# Patient Record
Sex: Female | Born: 1935 | Race: White | Hispanic: No | Smoking: Never smoker
Health system: Southern US, Community
[De-identification: ages and names within clinical notes are randomized; demographics above are authoritative.]

## PROBLEM LIST (undated history)

## (undated) DIAGNOSIS — N83209 Unspecified ovarian cyst, unspecified side: Secondary | ICD-10-CM

## (undated) DIAGNOSIS — M549 Dorsalgia, unspecified: Secondary | ICD-10-CM

## (undated) DIAGNOSIS — K219 Gastro-esophageal reflux disease without esophagitis: Secondary | ICD-10-CM

## (undated) DIAGNOSIS — K8020 Calculus of gallbladder without cholecystitis without obstruction: Secondary | ICD-10-CM

## (undated) DIAGNOSIS — K759 Inflammatory liver disease, unspecified: Secondary | ICD-10-CM

## (undated) DIAGNOSIS — I341 Nonrheumatic mitral (valve) prolapse: Secondary | ICD-10-CM

## (undated) DIAGNOSIS — N200 Calculus of kidney: Secondary | ICD-10-CM

## (undated) DIAGNOSIS — J449 Chronic obstructive pulmonary disease, unspecified: Secondary | ICD-10-CM

## (undated) DIAGNOSIS — E785 Hyperlipidemia, unspecified: Secondary | ICD-10-CM

## (undated) DIAGNOSIS — M2040 Other hammer toe(s) (acquired), unspecified foot: Secondary | ICD-10-CM

## (undated) DIAGNOSIS — H3530 Unspecified macular degeneration: Secondary | ICD-10-CM

## (undated) HISTORY — DX: Unspecified ovarian cyst, unspecified side: N83.209

## (undated) HISTORY — DX: Calculus of gallbladder without cholecystitis without obstruction: K80.20

## (undated) HISTORY — PX: AUGMENTATION MAMMAPLASTY: SUR837

## (undated) HISTORY — DX: Inflammatory liver disease, unspecified: K75.9

## (undated) HISTORY — DX: Calculus of kidney: N20.0

## (undated) HISTORY — DX: Gastro-esophageal reflux disease without esophagitis: K21.9

## (undated) HISTORY — PX: BREAST SURGERY: SHX581

## (undated) HISTORY — DX: Dorsalgia, unspecified: M54.9

---

## 1968-11-06 DIAGNOSIS — N200 Calculus of kidney: Secondary | ICD-10-CM

## 1968-11-06 HISTORY — DX: Calculus of kidney: N20.0

## 2004-08-25 ENCOUNTER — Ambulatory Visit: Payer: Self-pay | Admitting: Internal Medicine

## 2005-05-31 ENCOUNTER — Ambulatory Visit: Payer: Self-pay | Admitting: Internal Medicine

## 2005-07-01 ENCOUNTER — Emergency Department: Payer: Self-pay | Admitting: Emergency Medicine

## 2005-07-02 ENCOUNTER — Emergency Department: Payer: Self-pay | Admitting: Internal Medicine

## 2006-09-25 ENCOUNTER — Ambulatory Visit: Payer: Self-pay | Admitting: Unknown Physician Specialty

## 2007-07-04 ENCOUNTER — Ambulatory Visit: Payer: Self-pay | Admitting: Internal Medicine

## 2008-07-07 ENCOUNTER — Ambulatory Visit: Payer: Self-pay | Admitting: Internal Medicine

## 2009-06-29 ENCOUNTER — Ambulatory Visit: Payer: Self-pay | Admitting: Internal Medicine

## 2009-07-15 ENCOUNTER — Ambulatory Visit: Payer: Self-pay | Admitting: Internal Medicine

## 2010-07-20 ENCOUNTER — Ambulatory Visit: Payer: Self-pay | Admitting: Internal Medicine

## 2010-07-25 ENCOUNTER — Ambulatory Visit: Payer: Self-pay | Admitting: Internal Medicine

## 2010-08-31 ENCOUNTER — Ambulatory Visit: Payer: Self-pay | Admitting: Ophthalmology

## 2010-09-14 ENCOUNTER — Ambulatory Visit: Payer: Self-pay | Admitting: Ophthalmology

## 2010-11-06 HISTORY — PX: OTHER SURGICAL HISTORY: SHX169

## 2010-11-23 IMAGING — US SCREENING ULTRASOUND OF ABDOMINAL AORTA
1 series · 17 of 25 positions shown · non-contrast
Comparison: none

REASON FOR EXAM: polyp aorta  abd pain
COMMENTS:

[Series 1: screening ultrasound of abdominal aorta · 17 of 25 slices shown]
[im 1/25]
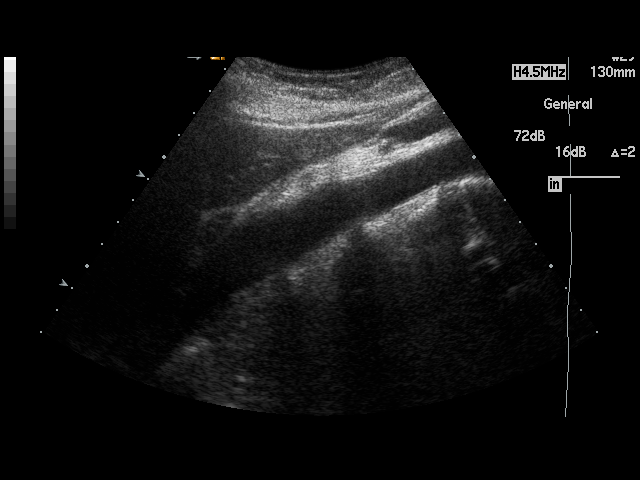
[im 3/25]
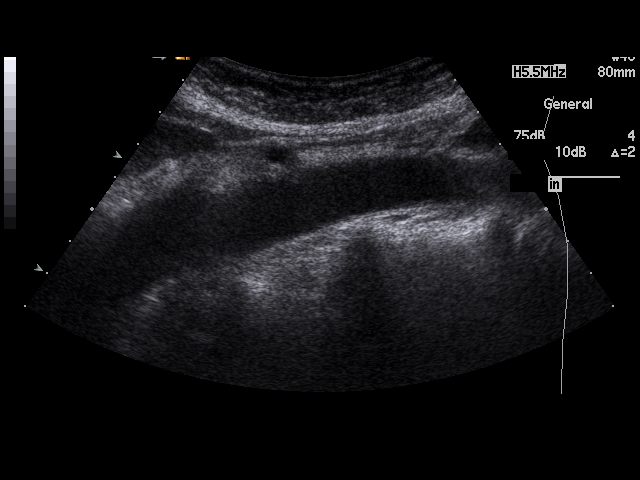
[im 4/25]
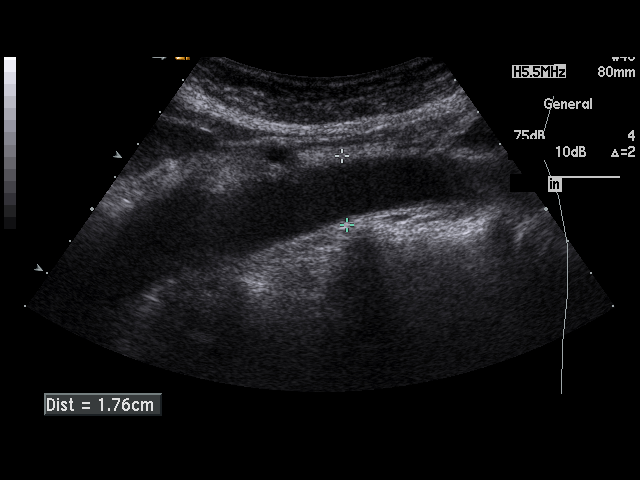
[im 6/25]
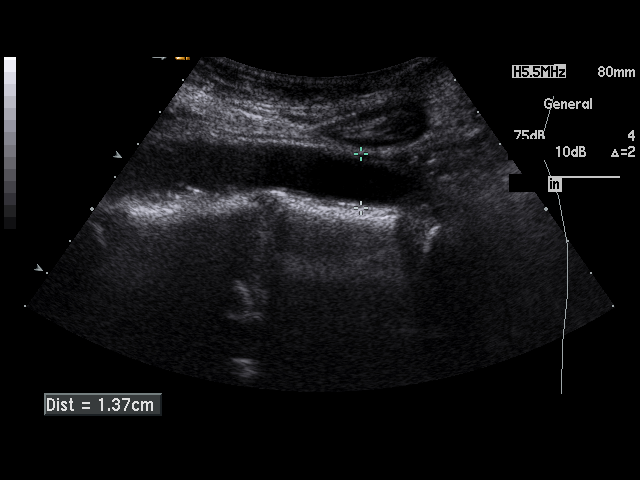
[im 7/25]
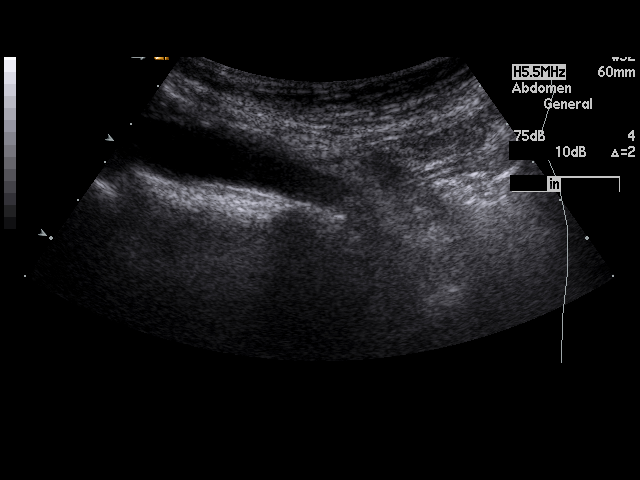
[im 9/25]
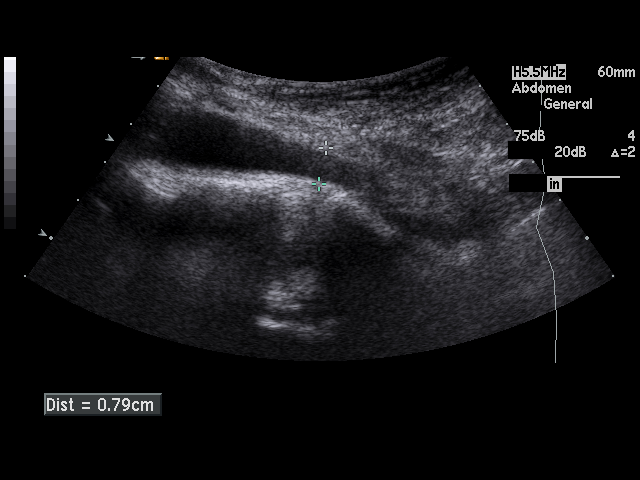
[im 10/25]
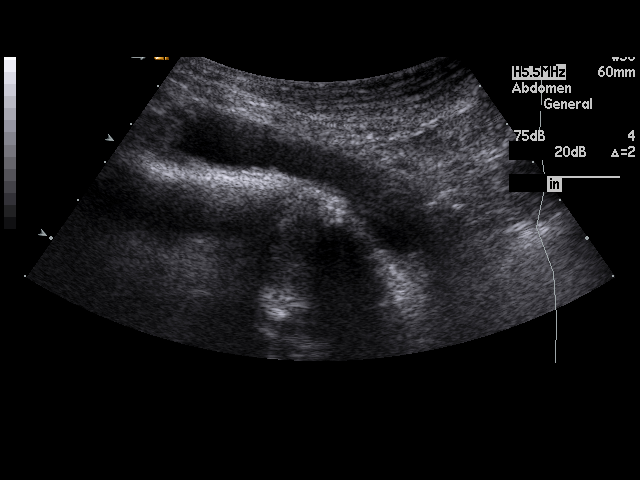
[im 12/25]
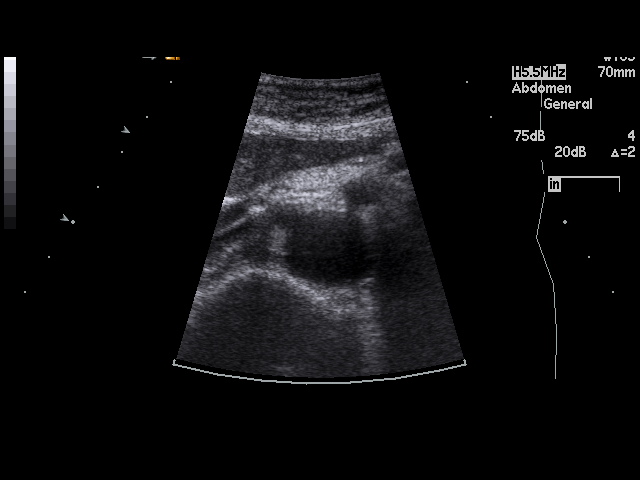
[im 13/25]
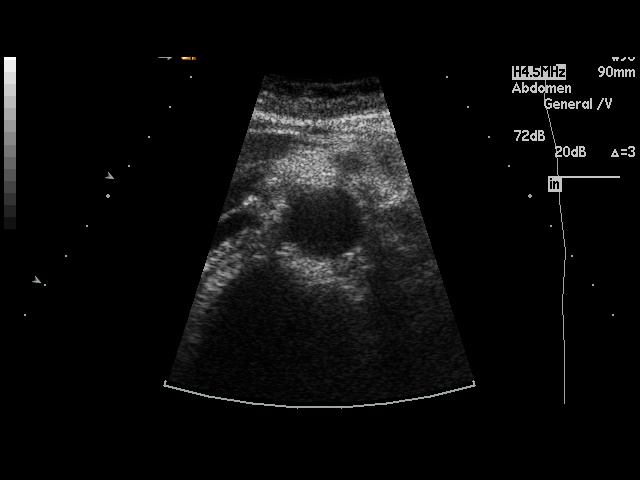
[im 14/25]
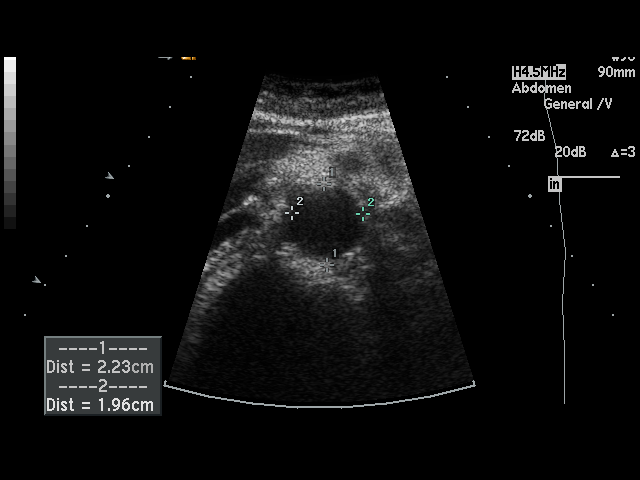
[im 16/25]
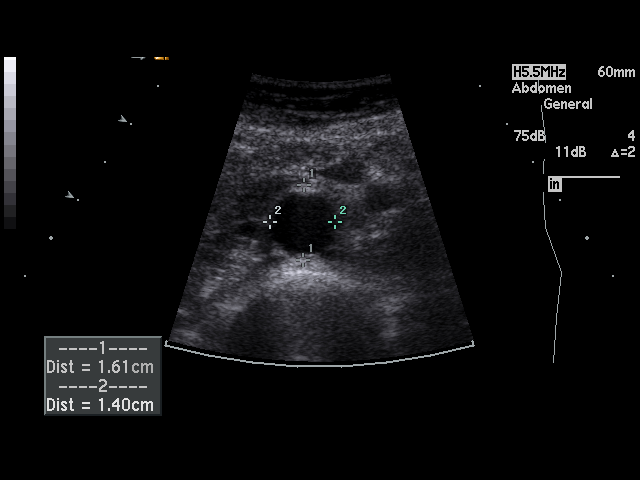
[im 17/25]
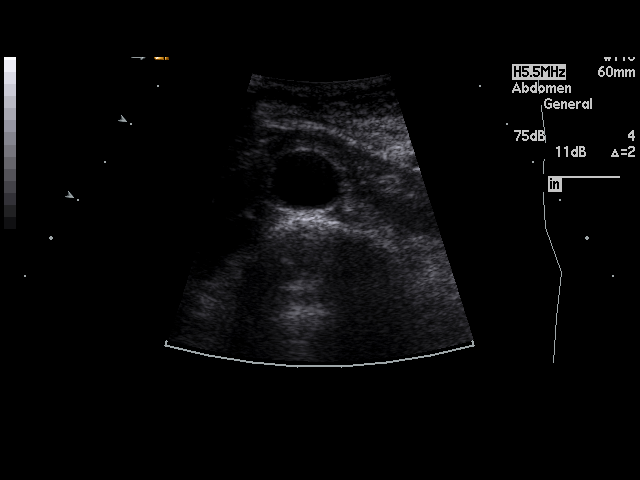
[im 19/25]
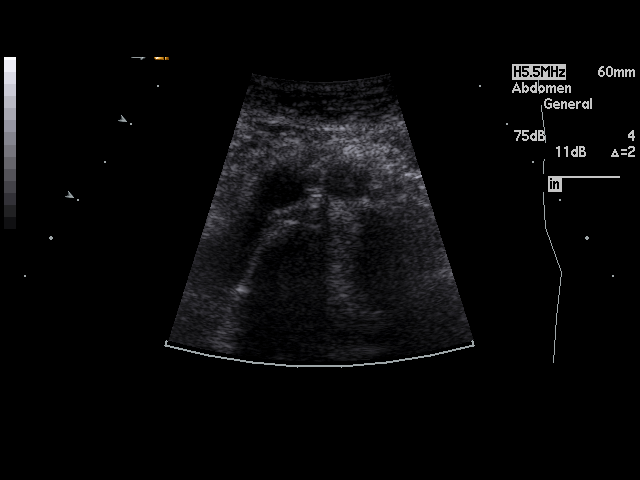
[im 20/25]
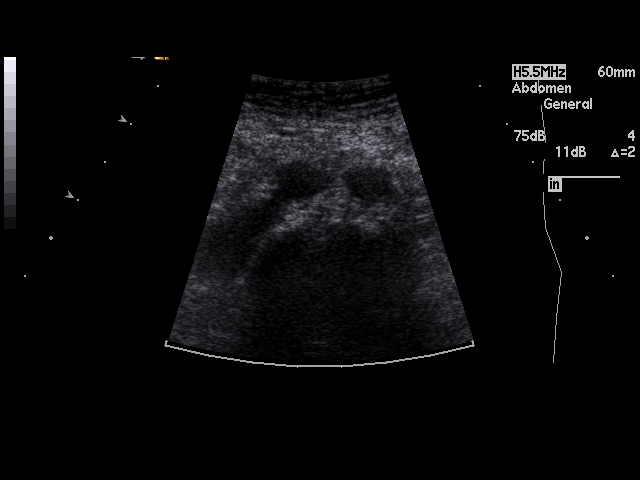
[im 22/25]
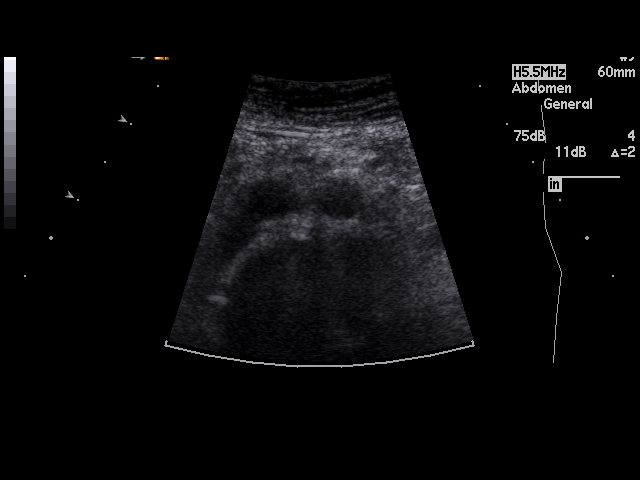
[im 23/25]
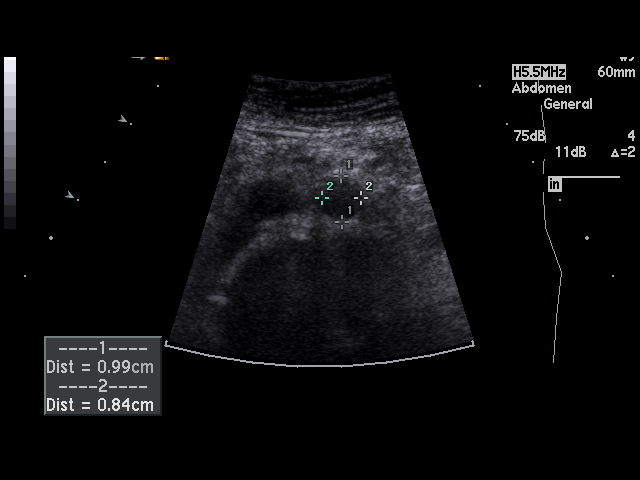
[im 25/25]
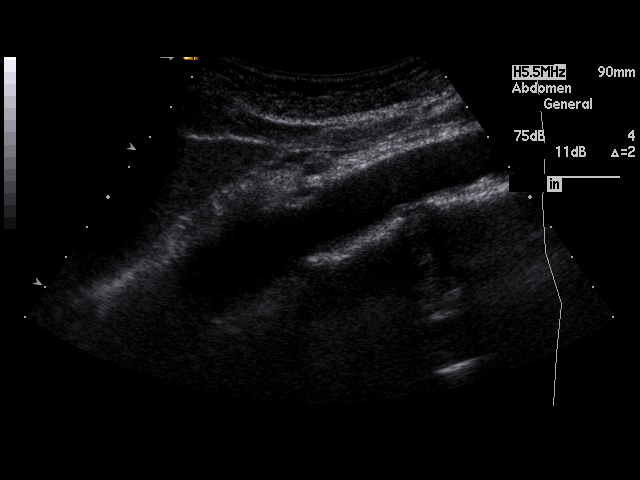

[17 of 25 positions shown; findings below may reference images not displayed]

PROCEDURE:     US  - US EXAM AAA SCREENING  - June 29, 2009  [DATE]

RESULT:     The abdominal aorta measures approximately 2.2 cm in maximal AP
dimension x 2 cm in maximal transverse dimension. No aneurysmal dilation is
demonstrated. The aorta appears to taper in a normal fashion. The right and
left common iliac arteries are normal in caliber at approximately 1 cm.
IMPRESSION: I do not see evidence of an abdominal aortic aneurysm nor
common iliac artery aneurysms.

## 2011-03-15 ENCOUNTER — Ambulatory Visit: Payer: Self-pay | Admitting: Internal Medicine

## 2011-08-22 ENCOUNTER — Ambulatory Visit: Payer: Self-pay | Admitting: Internal Medicine

## 2012-01-25 ENCOUNTER — Ambulatory Visit: Payer: Self-pay | Admitting: Internal Medicine

## 2012-08-23 ENCOUNTER — Ambulatory Visit: Payer: Self-pay | Admitting: Internal Medicine

## 2012-09-25 ENCOUNTER — Ambulatory Visit: Payer: Self-pay | Admitting: Ophthalmology

## 2012-09-29 ENCOUNTER — Emergency Department: Payer: Self-pay | Admitting: Emergency Medicine

## 2012-09-29 LAB — COMPREHENSIVE METABOLIC PANEL
Albumin: 4.1 g/dL (ref 3.4–5.0)
Alkaline Phosphatase: 48 U/L — ABNORMAL LOW (ref 50–136)
Anion Gap: 6 — ABNORMAL LOW (ref 7–16)
BUN: 10 mg/dL (ref 7–18)
Bilirubin,Total: 0.4 mg/dL (ref 0.2–1.0)
Calcium, Total: 9 mg/dL (ref 8.5–10.1)
Chloride: 110 mmol/L — ABNORMAL HIGH (ref 98–107)
Co2: 27 mmol/L (ref 21–32)
Creatinine: 0.53 mg/dL — ABNORMAL LOW (ref 0.60–1.30)
EGFR (African American): >60
EGFR (Non-African Amer.): >60
Glucose: 91 mg/dL (ref 65–99)
Osmolality: 284 (ref 275–301)
Potassium: 4.2 mmol/L (ref 3.5–5.1)
SGOT(AST): 33 U/L (ref 15–37)
SGPT (ALT): 29 U/L (ref 12–78)
Sodium: 143 mmol/L (ref 136–145)
Total Protein: 7.1 g/dL (ref 6.4–8.2)

## 2012-09-29 LAB — TROPONIN I: Troponin-I: <0.02 ng/mL

## 2012-09-29 LAB — PRO B NATRIURETIC PEPTIDE: B-Type Natriuretic Peptide: 576 pg/mL — ABNORMAL HIGH (ref 0–450)

## 2012-09-29 LAB — CBC
HCT: 41.6 % (ref 35.0–47.0)
HGB: 14.3 g/dL (ref 12.0–16.0)
MCH: 31 pg (ref 26.0–34.0)
MCHC: 34.4 g/dL (ref 32.0–36.0)
MCV: 90 fL (ref 80–100)
Platelet: 210 10*3/uL (ref 150–440)
RBC: 4.62 10*6/uL (ref 3.80–5.20)
RDW: 13.5 % (ref 11.5–14.5)
WBC: 8.5 10*3/uL (ref 3.6–11.0)

## 2012-09-29 LAB — CK TOTAL AND CKMB (NOT AT ARMC)
CK, Total: 37 U/L (ref 21–215)
CK-MB: 0.6 ng/mL (ref 0.5–3.6)

## 2012-10-11 ENCOUNTER — Ambulatory Visit: Payer: Self-pay | Admitting: Internal Medicine

## 2013-08-25 ENCOUNTER — Ambulatory Visit: Payer: Self-pay | Admitting: Internal Medicine

## 2013-11-06 DIAGNOSIS — N83209 Unspecified ovarian cyst, unspecified side: Secondary | ICD-10-CM

## 2013-11-06 HISTORY — DX: Unspecified ovarian cyst, unspecified side: N83.209

## 2014-05-04 ENCOUNTER — Ambulatory Visit: Payer: Self-pay | Admitting: Internal Medicine

## 2014-05-20 ENCOUNTER — Ambulatory Visit: Payer: Self-pay | Admitting: Surgery

## 2014-05-20 LAB — CREATININE, SERUM
Creatinine: 0.59 mg/dL — ABNORMAL LOW (ref 0.60–1.30)
EGFR (African American): >60
EGFR (Non-African Amer.): >60

## 2014-05-26 ENCOUNTER — Ambulatory Visit: Payer: Self-pay | Admitting: Surgery

## 2014-08-28 ENCOUNTER — Ambulatory Visit: Payer: Self-pay | Admitting: Internal Medicine

## 2015-04-15 ENCOUNTER — Ambulatory Visit (INDEPENDENT_AMBULATORY_CARE_PROVIDER_SITE_OTHER): Payer: Medicare Other | Admitting: Surgery

## 2015-04-15 ENCOUNTER — Encounter: Payer: Self-pay | Admitting: Surgery

## 2015-04-15 VITALS — BP 135/70 | HR 68 | Temp 98.0°F | Ht <= 58 in | Wt 82.6 lb

## 2015-04-15 DIAGNOSIS — R110 Nausea: Secondary | ICD-10-CM | POA: Diagnosis not present

## 2015-04-15 DIAGNOSIS — K8020 Calculus of gallbladder without cholecystitis without obstruction: Secondary | ICD-10-CM | POA: Diagnosis not present

## 2015-04-15 MED ORDER — ONDANSETRON HCL 4 MG PO TABS: 4.0000 mg | ORAL_TABLET | Freq: Three times a day (TID) | ORAL | Status: DC | PRN

## 2015-04-15 NOTE — Progress Notes (Signed)
Surgical Consultation  04/15/2015  Lindsey Burke is an 81 y.o. female.   Chief Complaint  Patient presents with  . Abdominal Pain     HPI: She is referred by her primary care physician for evaluation of persistent chronic nausea. I saw her last year with left lower quadrant pain and ultrasound demonstrating cholelithiasis. She weighed only 80 pounds at the time and her biggest complaint was her abdominal pain in the left lower quadrant. I did not think her symptoms were consistent with biliary tract disease and we did not recommend surgery at that time.  Since then she has been identified as having a left ovarian cyst which was treated by her primary care doctor and gynecology. She's not had any further left lower quadrant symptoms. However for the last month she's had almost daily nausea. She does not appear to have nausea related to any particular type of food or time of day. She cannot identify any antecedent episodes which precipitated her nausea. She does not have any diarrhea constipation fever or chills. She is eating fairly normally without increased nausea with meals.  She had recent laboratory values which were largely unremarkable. She's not had any follow-up imaging. She and her primary care doctor concerned about the possibility of symptomatic biliary tract disease. Her last colonoscopy was 8 years ago. She's not had a recent CT scan and has not had any EGD. Specifically she denies any history of hepatitis, yellow jaundice, pancreatitis, peptic ulcer disease, or diverticulitis.  Past Medical History  Diagnosis Date  . Cholelithiasis   . Back pain   . GERD (gastroesophageal reflux disease)   . Hepatitis     Past Surgical History  Procedure Laterality Date  . Colonscopy  2012    Unremarkable-Dr Mechele Collin    Family History  Problem Relation Age of Onset  . Other      No family hx    Social History:  reports that she has never smoked. She does not have any smokeless  tobacco history on file. She reports that she does not drink alcohol or use illicit drugs.  Allergies:  Allergies  Allergen Reactions  . Augmentin [Amoxicillin-Pot Clavulanate]     Medications reviewed.     Review of Systems  Constitutional: Negative for fever and weight loss.  HENT: Negative.   Eyes: Negative.   Respiratory: Positive for cough. Negative for sputum production, shortness of breath and wheezing.        Recent episode of URI symptoms  Cardiovascular: Negative for chest pain, palpitations and orthopnea.  Gastrointestinal: Positive for nausea and vomiting. Negative for heartburn, abdominal pain, diarrhea and constipation.  Genitourinary: Negative.   Musculoskeletal: Negative.   Skin: Negative.   Neurological: Positive for weakness.       Increased generalized weakness  Endo/Heme/Allergies: Negative.        BP 135/70 mmHg  Pulse 68  Temp(Src) 98 F (36.7 C) (Oral)  Ht 4\' 9"  (1.448 m)  Wt 82 lb 9.6 oz (37.467 kg)  BMI 17.87 kg/m2  Physical Exam  Constitutional: She is oriented to person, place, and time and well-developed, well-nourished, and in no distress.  HENT:  Head: Normocephalic.  Mouth/Throat: Oropharynx is clear and moist.  Eyes: Conjunctivae are normal. Pupils are equal, round, and reactive to light.  Neck: Normal range of motion. Neck supple. No tracheal deviation present. No thyromegaly present.  Cardiovascular: Normal rate, regular rhythm and normal heart sounds.   Pulmonary/Chest: Effort normal and breath sounds normal.  Abdominal: Soft.  Bowel sounds are normal. She exhibits no distension. There is no tenderness. There is no rebound and no guarding.  Musculoskeletal: Normal range of motion. She exhibits no edema or tenderness.  Neurological: She is alert and oriented to person, place, and time. No cranial nerve deficit.  Skin: Skin is warm and dry. No rash noted. No erythema.  Psychiatric: Memory and affect normal.      No results  found for this or any previous visit (from the past 48 hour(s)). No results found.  Assessment/Plan: 1. Nausea without vomiting I do not have a solution for her current problems. Her nausea does not seem typical for biliary tract disease. She's really had no further workup at this time. We will start her on some antinausea medicine see if it makes any difference while we continue the workup. - ondansetron (ZOFRAN) 4 MG tablet; Take 1 tablet (4 mg total) by mouth every 8 (eight) hours as needed for nausea or vomiting.  Dispense: 20 tablet; Refill: 0  2. Calculus of gallbladder without cholecystitis without obstruction We will set her up for a gallbladder ultrasound to see if she still has evidence of biliary tract disease with any significant change from a year ago. We may consider CT scan possibly EGD. We discussed this plan with her in detail and she is in agreement. - US Abdomen Limited RUQ; Future   [name] dermatitis

## 2015-04-15 NOTE — Patient Instructions (Signed)
You will need an Abdominal Ultrasound scheduled. Our office will call you with date and time of appt.  You will also need to follow-up in our office afterwards to review results and to decide the next step.

## 2015-04-16 ENCOUNTER — Other Ambulatory Visit: Payer: Self-pay | Admitting: Surgery

## 2015-04-29 ENCOUNTER — Ambulatory Visit
Admission: RE | Admit: 2015-04-29 | Discharge: 2015-04-29 | Disposition: A | Discharge status: Home / Self Care | Payer: Medicare Other | Source: Ambulatory Visit | Attending: Surgery | Admitting: Surgery

## 2015-04-29 DIAGNOSIS — K8020 Calculus of gallbladder without cholecystitis without obstruction: Secondary | ICD-10-CM | POA: Diagnosis not present

## 2015-04-29 DIAGNOSIS — R112 Nausea with vomiting, unspecified: Secondary | ICD-10-CM | POA: Diagnosis not present

## 2015-05-14 ENCOUNTER — Ambulatory Visit (INDEPENDENT_AMBULATORY_CARE_PROVIDER_SITE_OTHER): Payer: Medicare Other | Admitting: Surgery

## 2015-05-14 ENCOUNTER — Ambulatory Visit: Payer: Self-pay | Admitting: Surgery

## 2015-05-14 ENCOUNTER — Encounter: Payer: Self-pay | Admitting: Surgery

## 2015-05-14 VITALS — BP 135/81 | HR 78 | Temp 98.3°F | Ht 60.0 in | Wt 79.2 lb

## 2015-05-14 DIAGNOSIS — K8020 Calculus of gallbladder without cholecystitis without obstruction: Secondary | ICD-10-CM

## 2015-05-14 NOTE — Patient Instructions (Signed)
We will arrange for Dr. Mechele CollinElliott (your GI physician) to do an EGD to make sure that there is nothing else that may be causing your symptoms. We will contact his office and get this arranged.  After this testing is complete, you will need to call us to arrange your gallbladder surgery if this is what you decide to have done.  We will not need to see you back in the office for another visit prior to surgery, unless your symptoms change or new symptoms develop.

## 2015-05-15 ENCOUNTER — Encounter: Payer: Self-pay | Admitting: Surgery

## 2015-05-15 NOTE — Progress Notes (Signed)
Outpatient Surgical Follow Up  05/15/2015  Lindsey Burke is an 81 y.o. female.   Chief Complaint  Patient presents with  . Results    Abdominal Ultrasound  . General Surgery Consult    For Cholecystectomy    HPI: She returns to discuss her ultrasound findings. She continues to have profound nausea. Her ultrasound findings do show cholelithiasis without evidence of acute cholecystitis. She does not appear to have any ductal obstruction. She's having heartburn symptoms nausea with most foods and is still losing weight.  Past Medical History  Diagnosis Date  . Cholelithiasis   . Back pain   . GERD (gastroesophageal reflux disease)   . Hepatitis   . Ovarian cyst 2015  . Kidney stones 1970    Past Surgical History  Procedure Laterality Date  . Colonscopy  2012    Unremarkable-Dr Mechele CollinElliott    Family History  Problem Relation Age of Onset  . Cancer Mother   . Cholelithiasis Father   . Tuberculosis Father   . Cancer Father     Prostate and Larynx  . Cholelithiasis Sister     Social History:  reports that she has never smoked. She has never used smokeless tobacco. She reports that she drinks about 0.6 oz of alcohol per week. She reports that she does not use illicit drugs.  Allergies:  Allergies  Allergen Reactions  . Augmentin [Amoxicillin-Pot Clavulanate] Other (See Comments)    Elevated Liver Enzymes    Medications reviewed.    Review of Systems  Constitutional: Positive for weight loss and malaise/fatigue.  HENT: Negative.   Eyes: Negative.   Respiratory: Negative for cough and shortness of breath.   Cardiovascular: Negative for chest pain, palpitations and orthopnea.  Gastrointestinal: Positive for heartburn, nausea and abdominal pain. Negative for vomiting.  Genitourinary: Negative.   Musculoskeletal: Negative.   Skin: Negative.   Neurological: Negative.   Endo/Heme/Allergies: Negative.   Psychiatric/Behavioral: Negative.       BP 135/81 mmHg   Pulse 78  Temp(Src) 98.3 F (36.8 C) (Oral)  Ht 5' (1.524 m)  Wt 79 lb 3.2 oz (35.925 kg)  BMI 15.47 kg/m2  Physical Exam  Constitutional: She is oriented to person, place, and time and well-developed, well-nourished, and in no distress.  HENT:  Head: Normocephalic and atraumatic.  Eyes: Conjunctivae are normal. Pupils are equal, round, and reactive to light.  Neck: Normal range of motion. Neck supple.  Pulmonary/Chest: Effort normal. She has no wheezes.  Abdominal: Soft. Bowel sounds are normal.  Musculoskeletal: Normal range of motion.  Neurological: She is alert and oriented to person, place, and time.  Psychiatric: Mood and affect normal.       No results found for this or any previous visit (from the past 48 hour(s)). No results found.  Assessment/Plan:  1. Calculus of gallbladder without cholecystitis without obstruction We discussed the options again for surgical intervention for biliary tract disease. She remains reluctant to consider surgery. I thought the only other workup would be EGD. [facility] and so we will make ranges for an outpatient evaluation. If they choose endoscopy or feel that it is unnecessary we will readdress the question of surgery. I have left a decision to the patient and her family. They are in agreement     Tiney Rougealph Ely III  05/15/2015,negative

## 2015-05-17 ENCOUNTER — Telehealth: Payer: Self-pay

## 2015-05-17 NOTE — Telephone Encounter (Signed)
Called patient at this time. Appointment information given. Asked patient to arrive early for appointment and to bring Insurance card, ID, and copay (if any).  Patient states that she will touch base with me after her appointment for her EGD to get surgery scheduled.   (No need to see patient in office one again prior to scheduling surgery unless symptoms change)

## 2015-05-17 NOTE — Telephone Encounter (Signed)
Ambulatory referral was placed for patient to be seen for EGD to rule out any other reason that she could be having persistant Nausea prior to her making decision regarding Cholecystectomy.  Called office at this time to schedule appointment for patient.  Pt scheduled to see Amedeo KinsmanKimberly Mills (Dr Elliott's NP) at 1100am tomorrow morning (05/18/15).  Records faxed at this time to [phone].

## 2015-05-19 ENCOUNTER — Telehealth: Payer: Self-pay | Admitting: Gastroenterology

## 2015-05-19 ENCOUNTER — Other Ambulatory Visit: Payer: Self-pay | Admitting: Nurse Practitioner

## 2015-05-19 DIAGNOSIS — R1310 Dysphagia, unspecified: Secondary | ICD-10-CM

## 2015-05-19 DIAGNOSIS — R6889 Other general symptoms and signs: Secondary | ICD-10-CM

## 2015-05-19 DIAGNOSIS — R0989 Other specified symptoms and signs involving the circulatory and respiratory systems: Secondary | ICD-10-CM

## 2015-05-19 NOTE — Telephone Encounter (Signed)
LVM for pt to call and sch appt for dysphagia, reflux, and nausea with Dr. Servando SnareWohl

## 2015-05-25 ENCOUNTER — Ambulatory Visit
Admission: RE | Admit: 2015-05-25 | Discharge: 2015-05-25 | Disposition: A | Discharge status: Home / Self Care | Payer: Medicare Other | Source: Ambulatory Visit | Attending: Nurse Practitioner | Admitting: Nurse Practitioner

## 2015-05-25 DIAGNOSIS — R1310 Dysphagia, unspecified: Secondary | ICD-10-CM | POA: Diagnosis present

## 2015-05-25 DIAGNOSIS — K228 Other specified diseases of esophagus: Secondary | ICD-10-CM | POA: Insufficient documentation

## 2015-05-25 DIAGNOSIS — R0989 Other specified symptoms and signs involving the circulatory and respiratory systems: Secondary | ICD-10-CM

## 2015-05-25 DIAGNOSIS — R6889 Other general symptoms and signs: Secondary | ICD-10-CM

## 2015-06-17 ENCOUNTER — Encounter: Payer: Self-pay | Admitting: *Deleted

## 2015-06-18 ENCOUNTER — Encounter
Admission: RE | Disposition: A | Discharge status: Home / Self Care | Payer: Self-pay | Source: Ambulatory Visit | Attending: Unknown Physician Specialty

## 2015-06-18 ENCOUNTER — Ambulatory Visit: Payer: Medicare Other | Admitting: Certified Registered"

## 2015-06-18 ENCOUNTER — Encounter: Payer: Self-pay | Admitting: *Deleted

## 2015-06-18 ENCOUNTER — Ambulatory Visit
Admission: RE | Admit: 2015-06-18 | Discharge: 2015-06-18 | Disposition: A | Discharge status: Home / Self Care | Payer: Medicare Other | Source: Ambulatory Visit | Attending: Unknown Physician Specialty | Admitting: Unknown Physician Specialty

## 2015-06-18 DIAGNOSIS — R1310 Dysphagia, unspecified: Secondary | ICD-10-CM | POA: Diagnosis present

## 2015-06-18 DIAGNOSIS — I341 Nonrheumatic mitral (valve) prolapse: Secondary | ICD-10-CM | POA: Diagnosis not present

## 2015-06-18 DIAGNOSIS — Z79899 Other long term (current) drug therapy: Secondary | ICD-10-CM | POA: Insufficient documentation

## 2015-06-18 DIAGNOSIS — K224 Dyskinesia of esophagus: Secondary | ICD-10-CM | POA: Diagnosis not present

## 2015-06-18 DIAGNOSIS — Z7982 Long term (current) use of aspirin: Secondary | ICD-10-CM | POA: Diagnosis not present

## 2015-06-18 DIAGNOSIS — Z87442 Personal history of urinary calculi: Secondary | ICD-10-CM | POA: Insufficient documentation

## 2015-06-18 DIAGNOSIS — K219 Gastro-esophageal reflux disease without esophagitis: Secondary | ICD-10-CM | POA: Insufficient documentation

## 2015-06-18 DIAGNOSIS — J449 Chronic obstructive pulmonary disease, unspecified: Secondary | ICD-10-CM | POA: Insufficient documentation

## 2015-06-18 DIAGNOSIS — E785 Hyperlipidemia, unspecified: Secondary | ICD-10-CM | POA: Diagnosis not present

## 2015-06-18 HISTORY — DX: Chronic obstructive pulmonary disease, unspecified: J44.9

## 2015-06-18 HISTORY — DX: Hyperlipidemia, unspecified: E78.5

## 2015-06-18 HISTORY — DX: Other hammer toe(s) (acquired), unspecified foot: M20.40

## 2015-06-18 HISTORY — DX: Nonrheumatic mitral (valve) prolapse: I34.1

## 2015-06-18 HISTORY — DX: Calculus of kidney: N20.0

## 2015-06-18 HISTORY — DX: Unspecified macular degeneration: H35.30

## 2015-06-18 HISTORY — DX: Calculus of gallbladder without cholecystitis without obstruction: K80.20

## 2015-06-18 HISTORY — PX: SAVORY DILATION: SHX5439

## 2015-06-18 HISTORY — PX: ESOPHAGOGASTRODUODENOSCOPY (EGD) WITH PROPOFOL: SHX5813

## 2015-06-18 SURGERY — ESOPHAGOGASTRODUODENOSCOPY (EGD) WITH PROPOFOL
Anesthesia: General

## 2015-06-18 MED ORDER — FENTANYL CITRATE (PF) 100 MCG/2ML IJ SOLN
INTRAMUSCULAR | Status: DC | PRN
  Administered 2015-06-18: 50 ug via INTRAVENOUS

## 2015-06-18 MED ORDER — GLYCOPYRROLATE 0.2 MG/ML IJ SOLN
INTRAMUSCULAR | Status: DC | PRN
  Administered 2015-06-18: 0.1 mg via INTRAVENOUS

## 2015-06-18 MED ORDER — PHENYLEPHRINE HCL 10 MG/ML IJ SOLN
INTRAMUSCULAR | Status: DC | PRN
  Administered 2015-06-18: 50 ug via INTRAVENOUS

## 2015-06-18 MED ORDER — SODIUM CHLORIDE 0.9 % IV SOLN: INTRAVENOUS | Status: DC

## 2015-06-18 MED ORDER — PROPOFOL INFUSION 10 MG/ML OPTIME
INTRAVENOUS | Status: DC | PRN
  Administered 2015-06-18: 100 ug/kg/min via INTRAVENOUS

## 2015-06-18 MED ORDER — SODIUM CHLORIDE 0.9 % IV SOLN
INTRAVENOUS | Status: DC
  Administered 2015-06-18: 1000 mL via INTRAVENOUS

## 2015-06-18 MED ORDER — MIDAZOLAM HCL 2 MG/2ML IJ SOLN
INTRAMUSCULAR | Status: DC | PRN
Start: 2015-06-18 — End: 2015-06-18
  Administered 2015-06-18: 1 mg via INTRAVENOUS

## 2015-06-18 MED ORDER — LIDOCAINE HCL (CARDIAC) 20 MG/ML IV SOLN
INTRAVENOUS | Status: DC | PRN
  Administered 2015-06-18: 30 mg via INTRAVENOUS

## 2015-06-18 MED ORDER — EPHEDRINE SULFATE 50 MG/ML IJ SOLN
INTRAMUSCULAR | Status: DC | PRN
  Administered 2015-06-18: 5 mg via INTRAVENOUS
  Administered 2015-06-18: 10 mg via INTRAVENOUS

## 2015-06-18 NOTE — Transfer of Care (Signed)
Immediate Anesthesia Transfer of Care Note  Patient: Lindsey Burke  Procedure(s) Performed: Procedure(s): ESOPHAGOGASTRODUODENOSCOPY (EGD) WITH PROPOFOL (N/A) SAVORY DILATION (N/A)  Patient Location: PACU  Anesthesia Type:General  Level of Consciousness: awake and sedated  Airway & Oxygen Therapy: Patient Spontanous Breathing and Patient connected to nasal cannula oxygen  Post-op Assessment: Report given to RN and Post -op Vital signs reviewed and stable  Post vital signs: Reviewed  Last Vitals:  Filed Vitals:   06/18/15 0937  BP: 131/57  Pulse: 71  Temp: 36.7 C  Resp: 20    Complications: No apparent anesthesia complications

## 2015-06-18 NOTE — Anesthesia Preprocedure Evaluation (Signed)
Anesthesia Evaluation  Patient identified by MRN, date of birth, ID band Patient awake    Reviewed: Allergy & Precautions, H&P , NPO status , Patient's Chart, lab work & pertinent test results, reviewed documented beta blocker date and time   History of Anesthesia Complications Negative for: history of anesthetic complications  Airway Mallampati: I  TM Distance: >3 FB Neck ROM: full    Dental no notable dental hx.    Pulmonary neg shortness of breath, neg sleep apnea, COPDneg recent URI,  breath sounds clear to auscultation  Pulmonary exam normal       Cardiovascular Exercise Tolerance: Good - angina- CAD, - Past MI, - Cardiac Stents and - CABG Normal cardiovascular exam- dysrhythmias + Valvular Problems/Murmurs MVP Rhythm:regular Rate:Normal     Neuro/Psych negative neurological ROS  negative psych ROS   GI/Hepatic GERD-  Medicated,(+) Hepatitis -Gall stones   Endo/Other  negative endocrine ROS  Renal/GU Renal disease (kidney stones)  negative genitourinary   Musculoskeletal   Abdominal   Peds  Hematology negative hematology ROS (+)   Anesthesia Other Findings Past Medical History:   Cholelithiasis                                               Back pain                                                    GERD (gastroesophageal reflux disease)                       Hepatitis                                                    Ovarian cyst                                    2015         Hyperlipidemia                                               COPD (chronic obstructive pulmonary disease)                 Macular degeneration                                         Gallstones                                                   Ovarian cyst  Hammertoe                                                    MVP (mitral valve prolapse)                                  Kidney  stones                                   1970         Nephrolithiasis                                              Reproductive/Obstetrics negative OB ROS                             Anesthesia Physical Anesthesia Plan  ASA: II  Anesthesia Plan: General   Post-op Pain Management:    Induction:   Airway Management Planned:   Additional Equipment:   Intra-op Plan:   Post-operative Plan:   Informed Consent: I have reviewed the patients History and Physical, chart, labs and discussed the procedure including the risks, benefits and alternatives for the proposed anesthesia with the patient or authorized representative who has indicated his/her understanding and acceptance.   Dental Advisory Given  Plan Discussed with: Anesthesiologist, CRNA and Surgeon  Anesthesia Plan Comments:         Anesthesia Quick Evaluation

## 2015-06-18 NOTE — H&P (Signed)
Lindsey Prude, MD    Primary Care Physician:  Jaclyn Shaggy, MD Primary Gastroenterologist:  Dr. Mechele Collin  Pre-Procedure History & Physical: HPI:  Lindsey Burke is a 81 y.o. female is here for an endoscopy.   Past Medical History  Diagnosis Date  . Cholelithiasis   . Back pain   . GERD (gastroesophageal reflux disease)   . Hepatitis   . Ovarian cyst 2015  . Hyperlipidemia   . COPD (chronic obstructive pulmonary disease)   . Macular degeneration   . Gallstones   . Ovarian cyst   . Hammertoe   . MVP (mitral valve prolapse)   . Kidney stones 1970  . Nephrolithiasis     Past Surgical History  Procedure Laterality Date  . Colonscopy  2012    Unremarkable-Dr Mechele Collin  . Breast surgery      implants    Prior to Admission medications   Medication Sig Start Date End Date Taking? Authorizing Provider  alendronate (FOSAMAX) 70 MG tablet Take 1 tablet by mouth once a week. 04/07/15   Historical Provider, MD  aspirin EC 81 MG tablet Take 81 mg by mouth daily.    Historical Provider, MD  Calcium Carbonate (OS-CAL PO) Take 1 tablet by mouth daily.    Historical Provider, MD  gabapentin (NEURONTIN) 300 MG capsule Take 1 capsule by mouth at bedtime. 02/23/15   Historical Provider, MD  LORazepam (ATIVAN) 0.5 MG tablet Take 1 tablet by mouth every 8 (eight) hours as needed. 03/23/15   Historical Provider, MD  Multiple Vitamins-Minerals (PRESERVISION AREDS PO) Take 1 tablet by mouth daily.    Historical Provider, MD  Omega-3 Fatty Acids (FISH OIL) 1000 MG CAPS Take 1 capsule by mouth daily.    Historical Provider, MD  omeprazole (PRILOSEC) 20 MG capsule Take 20 mg by mouth daily.    Historical Provider, MD  ondansetron (ZOFRAN) 4 MG tablet Take 1 tablet (4 mg total) by mouth every 8 (eight) hours as needed for nausea or vomiting. Patient not taking: Reported on 05/14/2015 04/15/15   [name], MD    Allergies as of 05/31/2015 - Review Complete 05/15/2015  Allergen Reaction Noted  .  Augmentin [amoxicillin-pot clavulanate] Other (See Comments) 04/15/2015    Family History  Problem Relation Age of Onset  . Cancer Mother   . Cholelithiasis Father   . Tuberculosis Father   . Cancer Father     Prostate and Larynx  . Cholelithiasis Sister     Social History   Social History  . Marital Status: Married    Spouse Name: N/A  . Number of Children: N/A  . Years of Education: N/A   Occupational History  . Not on file.   Social History Main Topics  . Smoking status: Never Smoker   . Smokeless tobacco: Never Used  . Alcohol Use: 0.6 oz/week    1 Glasses of wine, 0 Standard drinks or equivalent per week     Comment: occasional  . Drug Use: No  . Sexual Activity: Not on file   Other Topics Concern  . Not on file   Social History Narrative    Review of Systems: See HPI, otherwise negative ROS  Physical Exam: BP 131/57 mmHg  Pulse 71  Temp(Src) 98.1 F (36.7 C) (Tympanic)  Resp 20  Ht 5' (1.524 m)  Wt 36.288 kg (80 lb)  BMI 15.62 kg/m2  SpO2 100% General:   Alert,  pleasant and cooperative in NAD Head:  Normocephalic and atraumatic. Neck:  Supple; no masses or thyromegaly. Lungs:  Clear throughout to auscultation.    Heart:  Regular rate and rhythm. Abdomen:  Soft, nontender and nondistended. Normal bowel sounds, without guarding, and without rebound.   Neurologic:  Alert and  oriented x4;  grossly normal neurologically.  Impression/Plan: Lindsey Burke is here for an endoscopy to be performed for dysphagia  Risks, benefits, limitations, and alternatives regarding  endoscopy have been reviewed with the patient.  Questions have been answered.  All parties agreeable.   Lindsey Prude, MD  06/18/2015, 10:04 AM

## 2015-06-18 NOTE — Anesthesia Procedure Notes (Signed)
Performed by: COOK-MARTIN, Sadao Weyer Pre-anesthesia Checklist: Patient identified, Emergency Drugs available, Suction available, Patient being monitored and Timeout performed Oxygen Delivery Method: Nasal cannula Preoxygenation: Pre-oxygenation with 100% oxygen Intubation Type: IV induction Airway Equipment and Method: Bite block Placement Confirmation: positive ETCO2 and CO2 detector     

## 2015-06-18 NOTE — Op Note (Signed)
[facility] Patient Name: Lindsey Burke Procedure Date: 06/18/2015 10:09 AM MRN: [id number] Account #: [id number] Date of Birth: 03/24/1934 Admit Type: Outpatient Age: 81 Room: [facility] ENDO ROOM 4 Gender: Female Note Status: Finalized Procedure:         Upper GI endoscopy Indications:       Dysphagia Providers:         Scot Jun, MD Referring MD:      Jillene Bucks. Arlana Pouch, MD (Referring MD) Medicines:         Propofol per Anesthesia Complications:     No immediate complications. Procedure:         Pre-Anesthesia Assessment:                    - After reviewing the risks and benefits, the patient was                     deemed in satisfactory condition to undergo the procedure.                    After obtaining informed consent, the endoscope was passed                     under direct vision. Throughout the procedure, the                     patient's blood pressure, pulse, and oxygen saturations                     were monitored continuously. The Olympus GIF-160 endoscope                     (S#. [id number]) was introduced through the mouth, and                     advanced to the second part of duodenum. The upper GI                     endoscopy was accomplished without difficulty. The patient                     tolerated the procedure well. Findings:      The examined esophagus was normal. After exam was done A TTS dilator was       passed through the scope. Dilation with a 15-16.5-18 mm balloon (to a       maximum balloon size of 18 mm) dilator was performed. Inflatted twice in       lower esophagus. No visible change in esophagus.      The stomach was normal.      The examined duodenum was normal. Impression:        - Normal esophagus. Dilated.                    - Normal stomach.                    - Normal examined duodenum.                    - No specimens collected. Recommendation:    - The findings and recommendations were discussed  with the                     patient's family. Diagnosis is esophageal spasm which  caused the barium swallow to be abnormal. Try Altoids                     Peppermint 30 minutes before eating. Scot Jun, MD 06/18/2015 10:32:47 AM This report has been signed electronically. Number of Addenda: 0 Note Initiated On: 06/18/2015 10:09 AM      [facility]

## 2015-06-20 NOTE — Anesthesia Postprocedure Evaluation (Signed)
  Anesthesia Post-op Note  Patient: Lindsey Burke  Procedure(s) Performed: Procedure(s): ESOPHAGOGASTRODUODENOSCOPY (EGD) WITH PROPOFOL (N/A) SAVORY DILATION (N/A)  Anesthesia type:General  Patient location: PACU  Post pain: Pain level controlled  Post assessment: Post-op Vital signs reviewed, Patient's Cardiovascular Status Stable, Respiratory Function Stable, Patent Airway and No signs of Nausea or vomiting  Post vital signs: Reviewed and stable  Last Vitals:  Filed Vitals:   06/18/15 1110  BP: 89/57  Pulse: 75  Temp:   Resp: 13    Level of consciousness: awake, alert  and patient cooperative  Complications: No apparent anesthesia complications

## 2015-07-01 ENCOUNTER — Telehealth: Payer: Self-pay | Admitting: Surgery

## 2015-07-01 NOTE — Telephone Encounter (Signed)
Patient called to let us know she is feeling well and does not want to go through with any surgery on her gallbladder. She is feeling well, eating well and states she will call back if anything changes.

## 2015-07-14 ENCOUNTER — Other Ambulatory Visit: Payer: Self-pay | Admitting: Internal Medicine

## 2015-07-14 DIAGNOSIS — Z1231 Encounter for screening mammogram for malignant neoplasm of breast: Secondary | ICD-10-CM

## 2015-07-14 DIAGNOSIS — M8580 Other specified disorders of bone density and structure, unspecified site: Secondary | ICD-10-CM

## 2015-07-15 ENCOUNTER — Other Ambulatory Visit: Payer: Self-pay | Admitting: Internal Medicine

## 2015-07-15 DIAGNOSIS — Z1231 Encounter for screening mammogram for malignant neoplasm of breast: Secondary | ICD-10-CM

## 2015-07-15 DIAGNOSIS — M810 Age-related osteoporosis without current pathological fracture: Secondary | ICD-10-CM

## 2015-08-30 ENCOUNTER — Ambulatory Visit: Payer: Medicare Other

## 2015-08-31 ENCOUNTER — Ambulatory Visit
Admission: RE | Admit: 2015-08-31 | Discharge: 2015-08-31 | Disposition: A | Discharge status: Home / Self Care | Payer: Medicare Other | Source: Ambulatory Visit | Attending: Internal Medicine | Admitting: Internal Medicine

## 2015-08-31 DIAGNOSIS — Z1231 Encounter for screening mammogram for malignant neoplasm of breast: Secondary | ICD-10-CM | POA: Diagnosis not present

## 2015-08-31 DIAGNOSIS — M810 Age-related osteoporosis without current pathological fracture: Secondary | ICD-10-CM

## 2015-09-19 IMAGING — US US ABDOMEN LIMITED
1 series · 14 of 25 positions shown · non-contrast
Comparison: CT scan 05/20/2014

CLINICAL DATA: Known gallstones, nausea and vomiting starting March 2015

EXAM:
US ABDOMEN LIMITED - RIGHT UPPER QUADRANT

[Series 1: us abdomen limited · 0.15mm/px · 14 of 54 slices shown]
[im 1/54]
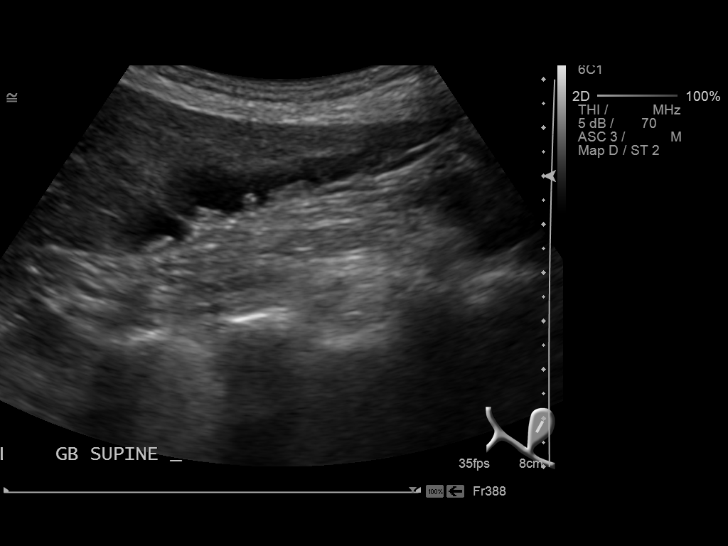
[im 5/54]
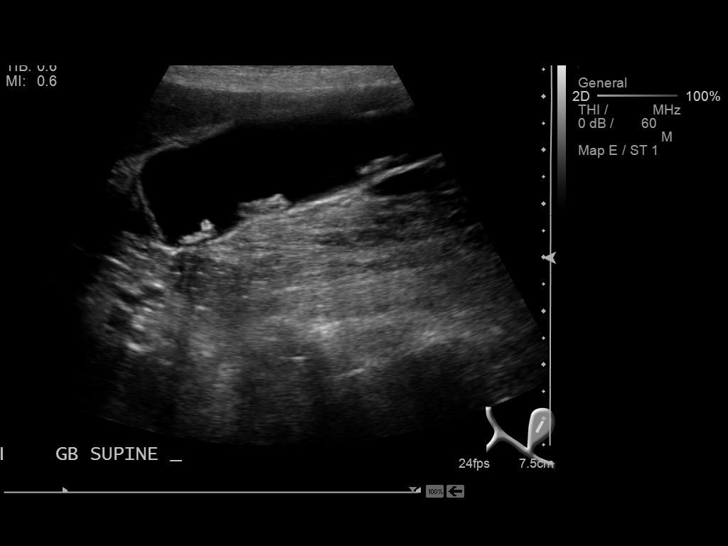
[im 9/54]
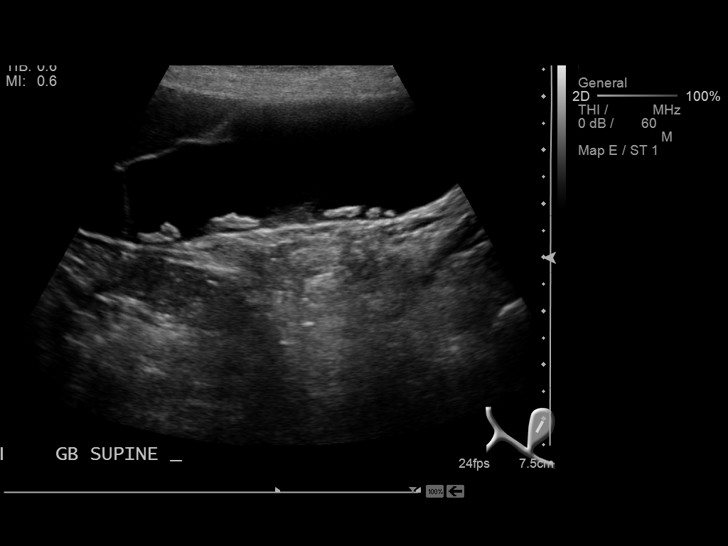
[im 14/54]
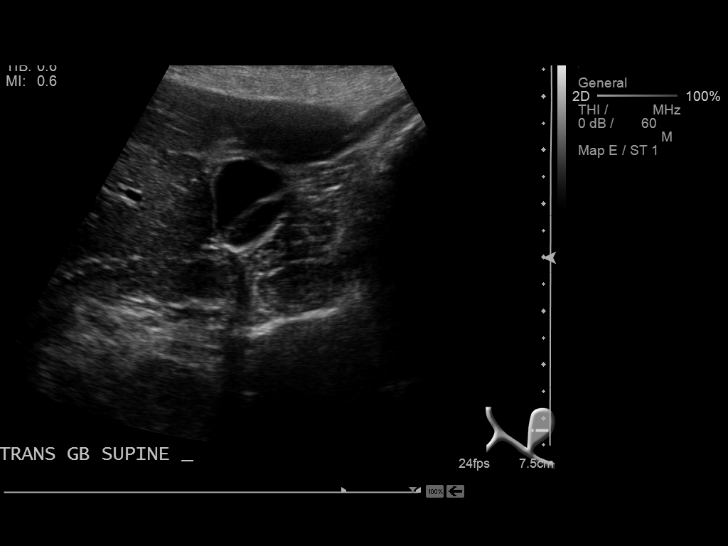
[im 18/54]
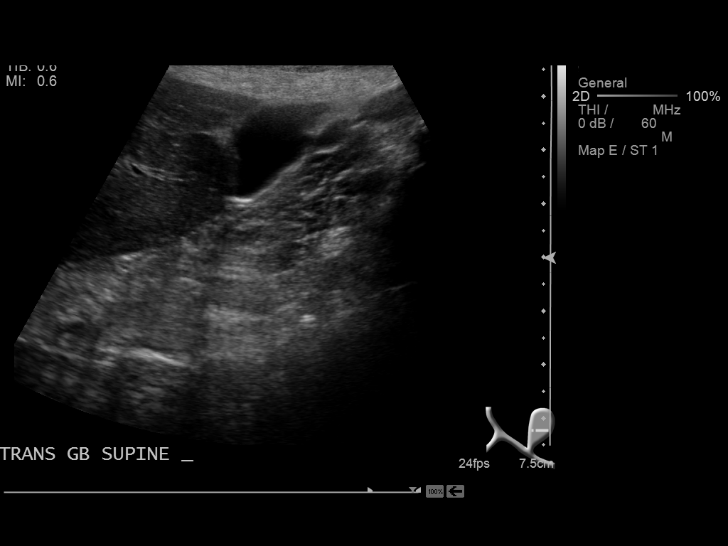
[im 20/54]
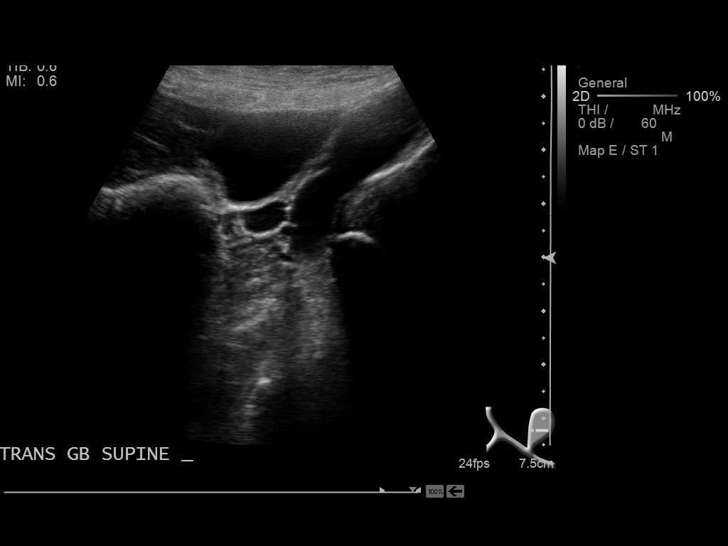
[im 25/54]
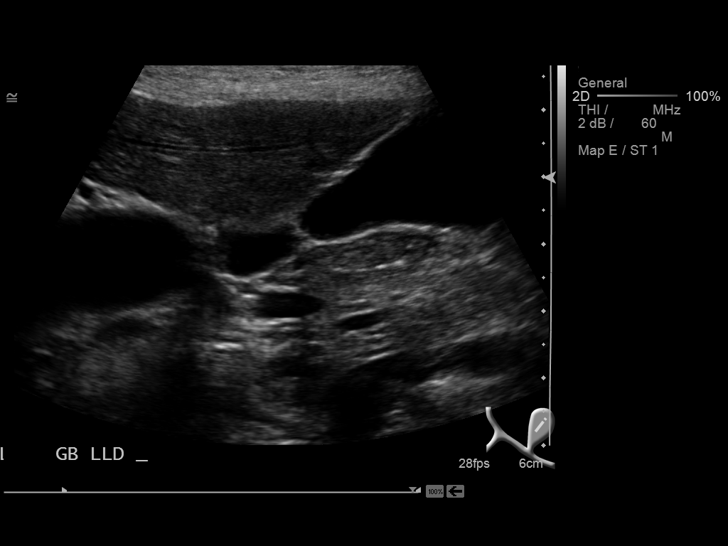
[im 29/54]
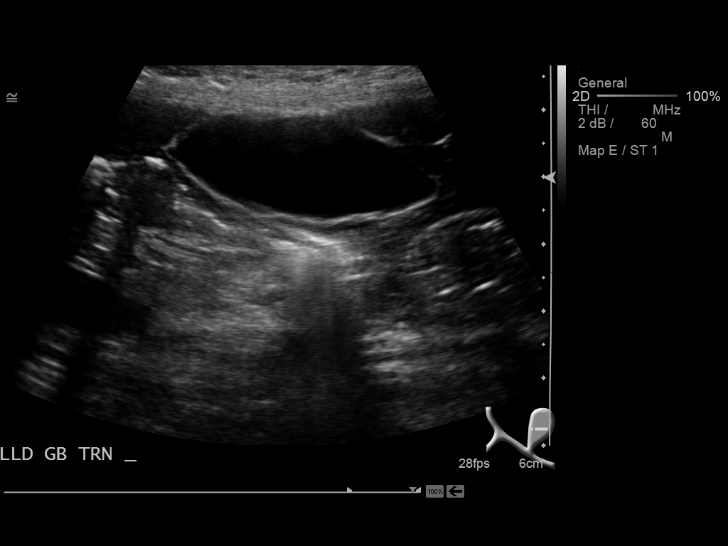
[im 34/54]
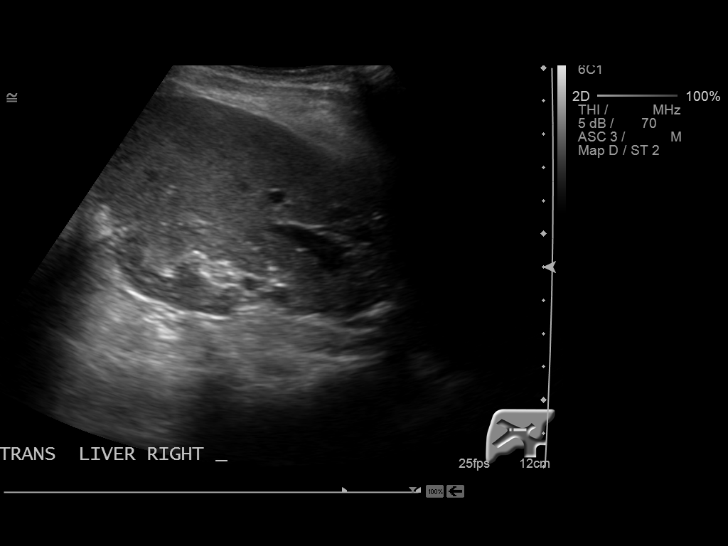
[im 36/54]
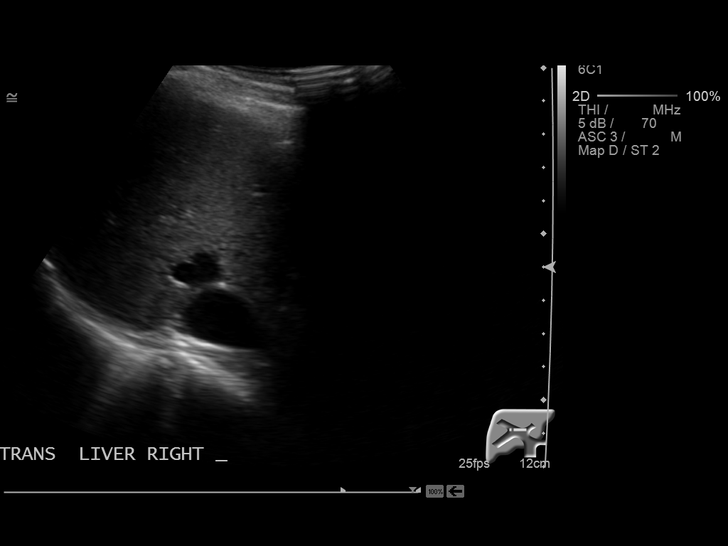
[im 40/54]
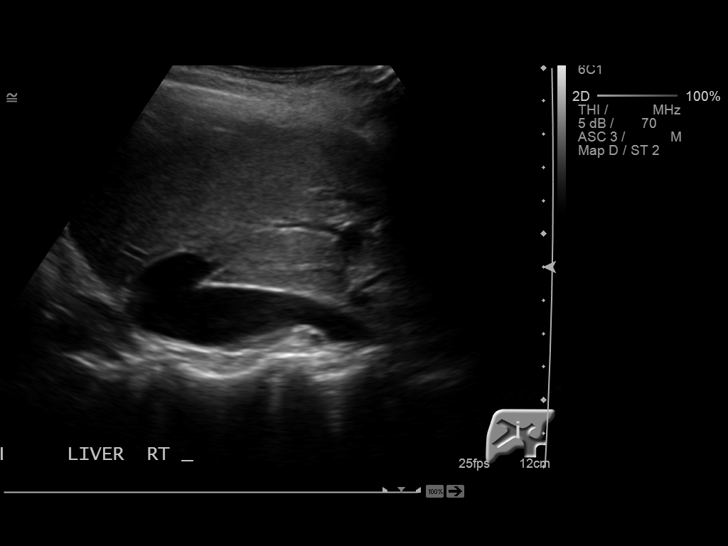
[im 45/54]
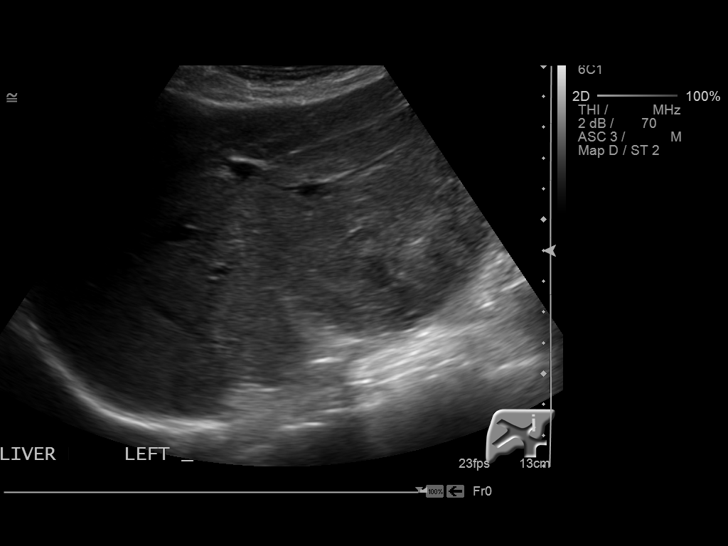
[im 49/54]
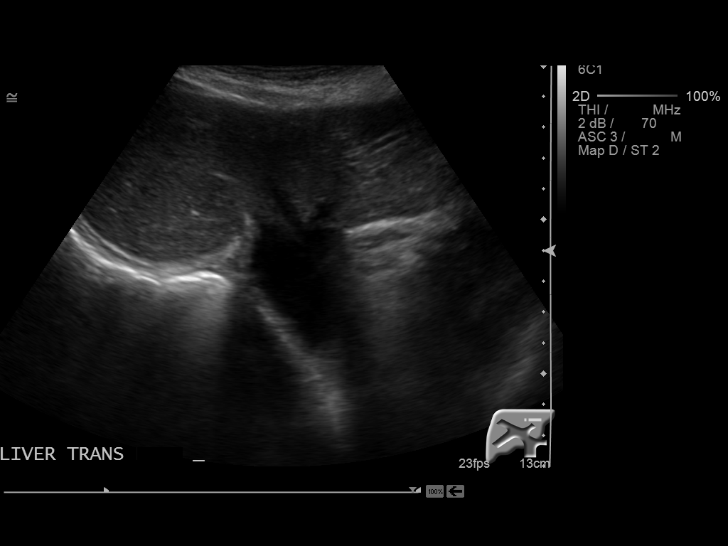
[im 54/54]
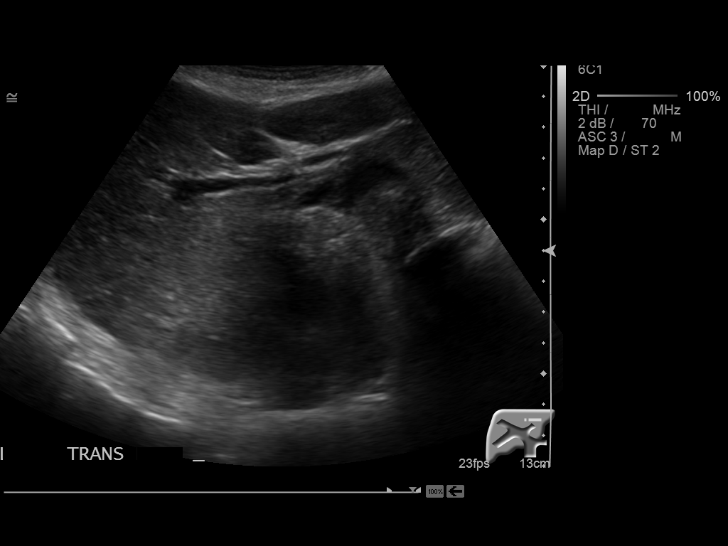

[14 of 25 positions shown; findings below may reference images not displayed]

FINDINGS: Gallbladder:

Gallstones are noted within gallbladder the largest measures 8.6 mm.
No thickening of gallbladder wall. No sonographic Murphy's sign.

Common bile duct:

Diameter: 2.2 mm in diameter within normal limits.

Liver:

No focal lesion identified. Within normal limits in parenchymal
echogenicity.
IMPRESSION: 1. Gallstones are noted within gallbladder the largest measures
mm. Normal CBD. No sonographic Murphy's sign. No thickening of
gallbladder wall.

## 2016-07-18 ENCOUNTER — Other Ambulatory Visit: Payer: Self-pay | Admitting: Internal Medicine

## 2016-07-18 DIAGNOSIS — Z1231 Encounter for screening mammogram for malignant neoplasm of breast: Secondary | ICD-10-CM

## 2016-08-31 ENCOUNTER — Ambulatory Visit
Admission: RE | Admit: 2016-08-31 | Discharge: 2016-08-31 | Disposition: A | Discharge status: Home / Self Care | Payer: Medicare Other | Source: Ambulatory Visit | Attending: Internal Medicine | Admitting: Internal Medicine

## 2016-08-31 ENCOUNTER — Other Ambulatory Visit: Payer: Self-pay | Admitting: Internal Medicine

## 2016-08-31 DIAGNOSIS — Z1231 Encounter for screening mammogram for malignant neoplasm of breast: Secondary | ICD-10-CM | POA: Insufficient documentation

## 2017-07-17 ENCOUNTER — Other Ambulatory Visit: Payer: Self-pay | Admitting: Internal Medicine

## 2017-07-17 DIAGNOSIS — Z1231 Encounter for screening mammogram for malignant neoplasm of breast: Secondary | ICD-10-CM

## 2017-09-05 ENCOUNTER — Ambulatory Visit
Admission: RE | Admit: 2017-09-05 | Discharge: 2017-09-05 | Disposition: A | Discharge status: Home / Self Care | Payer: Medicare Other | Source: Ambulatory Visit | Attending: Internal Medicine | Admitting: Internal Medicine

## 2017-09-05 DIAGNOSIS — Z1231 Encounter for screening mammogram for malignant neoplasm of breast: Secondary | ICD-10-CM | POA: Diagnosis not present

## 2018-07-10 ENCOUNTER — Other Ambulatory Visit: Payer: Self-pay | Admitting: Internal Medicine

## 2018-07-10 DIAGNOSIS — Z1231 Encounter for screening mammogram for malignant neoplasm of breast: Secondary | ICD-10-CM

## 2018-09-10 ENCOUNTER — Ambulatory Visit
Admission: RE | Admit: 2018-09-10 | Discharge: 2018-09-10 | Disposition: A | Discharge status: Home / Self Care | Payer: Medicare Other | Source: Ambulatory Visit | Attending: Internal Medicine | Admitting: Internal Medicine

## 2018-09-10 DIAGNOSIS — Z1231 Encounter for screening mammogram for malignant neoplasm of breast: Secondary | ICD-10-CM | POA: Insufficient documentation

## 2019-12-22 ENCOUNTER — Other Ambulatory Visit: Payer: Self-pay | Admitting: Internal Medicine

## 2019-12-22 DIAGNOSIS — Z1231 Encounter for screening mammogram for malignant neoplasm of breast: Secondary | ICD-10-CM

## 2020-03-22 ENCOUNTER — Ambulatory Visit
Admission: RE | Admit: 2020-03-22 | Discharge: 2020-03-22 | Disposition: A | Discharge status: Home / Self Care | Payer: Medicare Other | Source: Ambulatory Visit | Attending: Internal Medicine | Admitting: Internal Medicine

## 2020-03-22 DIAGNOSIS — Z1231 Encounter for screening mammogram for malignant neoplasm of breast: Secondary | ICD-10-CM | POA: Diagnosis not present

## 2021-03-09 ENCOUNTER — Other Ambulatory Visit: Payer: Self-pay | Admitting: Internal Medicine

## 2021-03-09 DIAGNOSIS — Z1231 Encounter for screening mammogram for malignant neoplasm of breast: Secondary | ICD-10-CM

## 2021-03-13 ENCOUNTER — Emergency Department: Payer: Medicare Other

## 2021-03-13 ENCOUNTER — Emergency Department
Admission: EM | Admit: 2021-03-13 | Discharge: 2021-03-13 | Disposition: A | Discharge status: Home / Self Care | Payer: Medicare Other | Attending: Emergency Medicine | Admitting: Emergency Medicine

## 2021-03-13 ENCOUNTER — Other Ambulatory Visit: Payer: Self-pay

## 2021-03-13 DIAGNOSIS — Z7982 Long term (current) use of aspirin: Secondary | ICD-10-CM | POA: Diagnosis not present

## 2021-03-13 DIAGNOSIS — H50021 Monocular esotropia with A pattern, right eye: Secondary | ICD-10-CM | POA: Insufficient documentation

## 2021-03-13 DIAGNOSIS — J449 Chronic obstructive pulmonary disease, unspecified: Secondary | ICD-10-CM | POA: Diagnosis not present

## 2021-03-13 DIAGNOSIS — H532 Diplopia: Secondary | ICD-10-CM

## 2021-03-13 DIAGNOSIS — H5789 Other specified disorders of eye and adnexa: Secondary | ICD-10-CM | POA: Diagnosis present

## 2021-03-13 NOTE — ED Provider Notes (Signed)
[facility] Emergency Department Provider Note   ____________________________________________   Event Date/Time   First MD Initiated Contact with Patient 03/13/21 0801     (approximate)  I have reviewed the triage vital signs and the nursing notes.   HISTORY  Chief Complaint Eye Problem    HPI Edla Para is a 87 y.o. female with below stated past medical history presents for diplopia in her right eye for the past week.  Patient states that she fell on April 25 and hit her head but did not not seek any medical attention due to the low impact.  Patient then went to urgent care for the right thigh double vision after she noticed it on a home eye chart.  Patient states that it looks as though there are "2 lines" in the vision of her right eye only when she stands approximately 14 inches or closer to an image.  Patient states that this double vision does not occur at distances farther than 14 inches and she can read without difficulty.  Patient denies any double vision with both eyes open.  Patient states that she had her vision checked at her ophthalmologist in January of this year and was told that everything was normal.  Patient denies any improvement or worsening over this last week.  Patient denies any other falls or head trauma.  Patient denies any numbness/weakness/paresthesias in any extremity         Past Medical History:  Diagnosis Date  . Back pain   . Cholelithiasis   . COPD (chronic obstructive pulmonary disease) (HCC)   . Gallstones   . GERD (gastroesophageal reflux disease)   . Hammertoe   . Hepatitis   . Hyperlipidemia   . Kidney stones 1970  . Macular degeneration   . MVP (mitral valve prolapse)   . Nephrolithiasis   . Ovarian cyst 2015  . Ovarian cyst     There are no problems to display for this patient.   Past Surgical History:  Procedure Laterality Date  . AUGMENTATION MAMMAPLASTY Bilateral    bilateral implants  .  BREAST SURGERY     implants  . colonscopy  2012   Unremarkable-Dr Mechele Collin  . ESOPHAGOGASTRODUODENOSCOPY (EGD) WITH PROPOFOL N/A 06/18/2015   Procedure: ESOPHAGOGASTRODUODENOSCOPY (EGD) WITH PROPOFOL;  Surgeon: Scot Jun, MD;  Location: [facility] ENDOSCOPY;  Service: Endoscopy;  Laterality: N/A;  . SAVORY DILATION N/A 06/18/2015   Procedure: SAVORY DILATION;  Surgeon: Scot Jun, MD;  Location: [facility] ENDOSCOPY;  Service: Endoscopy;  Laterality: N/A;    Prior to Admission medications   Medication Sig Start Date End Date Taking? Authorizing Provider  alendronate (FOSAMAX) 70 MG tablet Take 1 tablet by mouth once a week. 04/07/15   [provider]  aspirin EC 81 MG tablet Take 81 mg by mouth daily.    [provider]  Calcium Carbonate (OS-CAL PO) Take 1 tablet by mouth daily.    [provider]  gabapentin (NEURONTIN) 300 MG capsule Take 1 capsule by mouth at bedtime. 02/23/15   [provider]  LORazepam (ATIVAN) 0.5 MG tablet Take 1 tablet by mouth every 8 (eight) hours as needed. 03/23/15   [provider]  Multiple Vitamins-Minerals (PRESERVISION AREDS PO) Take 1 tablet by mouth daily.    [provider]  Omega-3 Fatty Acids (FISH OIL) 1000 MG CAPS Take 1 capsule by mouth daily.    [provider]  omeprazole (PRILOSEC) 20 MG capsule Take 20 mg  by mouth daily.    [provider]    Allergies Augmentin [amoxicillin-pot clavulanate]  Family History  Problem Relation Age of Onset  . Cancer Mother   . Cholelithiasis Father   . Tuberculosis Father   . Cancer Father        Prostate and Larynx  . Cholelithiasis Sister   . Breast cancer Maternal Aunt     Social History Social History   Tobacco Use  . Smoking status: Never Smoker  . Smokeless tobacco: Never Used  Substance Use Topics  . Alcohol use: Yes    Alcohol/week: 1.0 standard drink    Types: 1 Glasses of wine per week    Comment: occasional  . Drug  use: No    Review of Systems Constitutional: No fever/chills Eyes: Endorses right eye diplopia ENT: No sore throat. Cardiovascular: Denies chest pain. Respiratory: Denies shortness of breath. Gastrointestinal: No abdominal pain.  No nausea, no vomiting.  No diarrhea. Genitourinary: Negative for dysuria. Musculoskeletal: Negative for acute arthralgias Skin: Negative for rash. Neurological: Negative for headaches, weakness/numbness/paresthesias in any extremity Psychiatric: Negative for suicidal ideation/homicidal ideation   ____________________________________________   PHYSICAL EXAM:  VITAL SIGNS: ED Triage Vitals  Enc Vitals Group     BP 03/13/21 0801 (!) 144/103     Pulse Rate 03/13/21 0801 78     Resp 03/13/21 0801 16     Temp 03/13/21 0801 98.1 F (36.7 C)     Temp Source 03/13/21 0801 Oral     SpO2 03/13/21 0801 100 %     Weight 03/13/21 0800 80 lb (36.3 kg)     Height 03/13/21 0800 5' (1.524 m)     Head Circumference --      Peak Flow --      Pain Score 03/13/21 0800 0     Pain Loc --      Pain Edu? --      Excl. in GC? --    Constitutional: Alert and oriented. Well appearing and in no acute distress. Eyes: Conjunctivae are normal. PERRL.  Visual acuity intact in bilateral eyes at a distance with subjective right eye horizontal diplopia Head: Atraumatic. Nose: No congestion/rhinnorhea. Mouth/Throat: Mucous membranes are moist. Neck: No stridor Cardiovascular: Grossly normal heart sounds.  Good peripheral circulation. Respiratory: Normal respiratory effort.  No retractions. Gastrointestinal: Soft and nontender. No distention. Musculoskeletal: No obvious deformities Neurologic:  Normal speech and language. No gross focal neurologic deficits are appreciated. Skin:  Skin is warm and dry. No rash noted. Psychiatric: Mood and affect are normal. Speech and behavior are normal.  ____________________________________________   LABS (all labs ordered are listed,  but only abnormal results are displayed)  Labs Reviewed - No data to display  RADIOLOGY  ED MD interpretation: CT of the head without contrast shows no evidence of acute abnormalities  Official radiology report(s): CT Head Wo Contrast  Result Date: 03/13/2021 CLINICAL DATA:  87 year old female with diplopia, seeing double in her right eye x1 week. Fall last month. EXAM: CT HEAD WITHOUT CONTRAST TECHNIQUE: Contiguous axial images were obtained from the base of the skull through the vertex without intravenous contrast. COMPARISON:  None. FINDINGS: Brain: Cerebral volume is within normal limits for age. No midline shift, ventriculomegaly, mass effect, evidence of mass lesion, intracranial hemorrhage or evidence of cortically based acute infarction. Gray-white matter differentiation is within normal limits for age. No cortical encephalomalacia identified. Suprasellar cistern and cavernous sinuses have an unremarkable noncontrast CT appearance. Vascular: Calcified atherosclerosis at the skull  base. Dominant appearing left vertebral artery. No suspicious intracranial vascular hyperdensity. Skull: No acute osseous abnormality identified. Sinuses/Orbits: Minimal bubbly opacity in a posterior right ethmoid air cell. Other Visualized paranasal sinuses and mastoids are clear. Other: Postoperative changes to both globes, otherwise symmetric and negative visible orbits soft tissues. Visualized scalp soft tissues are within normal limits. IMPRESSION: Normal for age non contrast CT appearance of the brain. No explanation for diplopia. Electronically Signed   By: Odessa Fleming M.D.   On: 03/13/2021 08:56    ____________________________________________   PROCEDURES  Procedure(s) performed (including Critical Care):  Procedures   ____________________________________________   INITIAL IMPRESSION / ASSESSMENT AND PLAN / ED COURSE  As part of my medical decision making, I reviewed the following data within the  electronic MEDICAL RECORD NUMBER Nursing notes reviewed and incorporated, Labs reviewed, EKG interpreted, Old chart reviewed, Radiograph reviewed and Notes from prior ED visits reviewed and incorporated     Workup: CT brain with and without contrast   Based on the history, exam, and any tests done, I do not believe there is a vision threatening emergent cause of the diploplia, including, but not limited to, CVA, Dissection, Intracranial Mass, Meningitis involving brainstem, Myesthenia Gravis, MS, Hyperthyroid proptosis, CST, diabetic retinopathy, Globe Rupture, or Muscular Entrapment from trauma.  Disposition: Discharge with instructions for prompt Ophthalmology and Primary Care follow up. Instructed no driving while symptomatic.       ____________________________________________   FINAL CLINICAL IMPRESSION(S) / ED DIAGNOSES  Final diagnoses:  Monocular diplopia of right eye     ED Discharge Orders    None       Note:  This document was prepared using Dragon voice recognition software and may include unintentional dictation errors.   Merwyn Katos, MD 03/13/21 [phone]

## 2021-03-13 NOTE — ED Triage Notes (Signed)
Pt c/o seeing double in her right eye x 1 week. Pt had a fall on April 25th and hit her head, but did not tell anyone. Pt went to urgent care last week for the double vision and was told to come to ED if not improving. Pt states at urgent care they told her vision was normal. Pt states she had her vision checked in Jan of this year at her eye doctor and vision was normal. Pt denies pain. Pt denies taking any blood thinners.

## 2021-03-13 NOTE — ED Notes (Signed)
Patient transported to CT 

## 2021-03-23 ENCOUNTER — Other Ambulatory Visit: Payer: Self-pay

## 2021-03-23 ENCOUNTER — Ambulatory Visit
Admission: RE | Admit: 2021-03-23 | Discharge: 2021-03-23 | Disposition: A | Discharge status: Home / Self Care | Payer: Medicare Other | Source: Ambulatory Visit | Attending: Internal Medicine | Admitting: Internal Medicine

## 2021-03-23 DIAGNOSIS — Z1231 Encounter for screening mammogram for malignant neoplasm of breast: Secondary | ICD-10-CM | POA: Diagnosis not present

## 2021-10-12 ENCOUNTER — Emergency Department: Payer: Medicare Other

## 2021-10-12 ENCOUNTER — Encounter: Payer: Self-pay | Admitting: Emergency Medicine

## 2021-10-12 ENCOUNTER — Inpatient Hospital Stay
Admission: EM | Admit: 2021-10-12 | Discharge: 2021-10-16 | DRG: 178 | Disposition: A | Discharge status: Home Health | Payer: Medicare Other | Attending: Student | Admitting: Student

## 2021-10-12 ENCOUNTER — Other Ambulatory Visit: Payer: Self-pay

## 2021-10-12 DIAGNOSIS — Z87442 Personal history of urinary calculi: Secondary | ICD-10-CM

## 2021-10-12 DIAGNOSIS — J449 Chronic obstructive pulmonary disease, unspecified: Secondary | ICD-10-CM | POA: Diagnosis present

## 2021-10-12 DIAGNOSIS — F419 Anxiety disorder, unspecified: Secondary | ICD-10-CM | POA: Diagnosis present

## 2021-10-12 DIAGNOSIS — E785 Hyperlipidemia, unspecified: Secondary | ICD-10-CM | POA: Diagnosis present

## 2021-10-12 DIAGNOSIS — U071 COVID-19: Secondary | ICD-10-CM | POA: Diagnosis not present

## 2021-10-12 DIAGNOSIS — B9620 Unspecified Escherichia coli [E. coli] as the cause of diseases classified elsewhere: Secondary | ICD-10-CM | POA: Diagnosis present

## 2021-10-12 DIAGNOSIS — N3001 Acute cystitis with hematuria: Secondary | ICD-10-CM

## 2021-10-12 DIAGNOSIS — R1314 Dysphagia, pharyngoesophageal phase: Secondary | ICD-10-CM | POA: Diagnosis present

## 2021-10-12 DIAGNOSIS — Z7982 Long term (current) use of aspirin: Secondary | ICD-10-CM

## 2021-10-12 DIAGNOSIS — I248 Other forms of acute ischemic heart disease: Secondary | ICD-10-CM | POA: Diagnosis present

## 2021-10-12 DIAGNOSIS — Z881 Allergy status to other antibiotic agents status: Secondary | ICD-10-CM

## 2021-10-12 DIAGNOSIS — R531 Weakness: Secondary | ICD-10-CM

## 2021-10-12 DIAGNOSIS — M549 Dorsalgia, unspecified: Secondary | ICD-10-CM | POA: Diagnosis present

## 2021-10-12 DIAGNOSIS — Z681 Body mass index (BMI) 19 or less, adult: Secondary | ICD-10-CM

## 2021-10-12 DIAGNOSIS — H3530 Unspecified macular degeneration: Secondary | ICD-10-CM | POA: Diagnosis present

## 2021-10-12 DIAGNOSIS — R636 Underweight: Secondary | ICD-10-CM | POA: Diagnosis present

## 2021-10-12 DIAGNOSIS — K219 Gastro-esophageal reflux disease without esophagitis: Secondary | ICD-10-CM | POA: Diagnosis present

## 2021-10-12 DIAGNOSIS — N390 Urinary tract infection, site not specified: Secondary | ICD-10-CM | POA: Diagnosis present

## 2021-10-12 DIAGNOSIS — Z79899 Other long term (current) drug therapy: Secondary | ICD-10-CM

## 2021-10-12 DIAGNOSIS — Z7983 Long term (current) use of bisphosphonates: Secondary | ICD-10-CM

## 2021-10-12 DIAGNOSIS — M6282 Rhabdomyolysis: Secondary | ICD-10-CM | POA: Diagnosis present

## 2021-10-12 DIAGNOSIS — R1310 Dysphagia, unspecified: Secondary | ICD-10-CM

## 2021-10-12 DIAGNOSIS — R778 Other specified abnormalities of plasma proteins: Secondary | ICD-10-CM

## 2021-10-12 DIAGNOSIS — R079 Chest pain, unspecified: Secondary | ICD-10-CM

## 2021-10-12 DIAGNOSIS — K222 Esophageal obstruction: Secondary | ICD-10-CM | POA: Diagnosis present

## 2021-10-12 LAB — CK: Total CK: 248 U/L — ABNORMAL HIGH (ref 38–234)

## 2021-10-12 LAB — URINALYSIS, ROUTINE W REFLEX MICROSCOPIC
Bilirubin Urine: NEGATIVE
Glucose, UA: NEGATIVE mg/dL
Ketones, ur: >160 mg/dL — AB
Nitrite: POSITIVE — AB
Protein, ur: 100 mg/dL — AB
Specific Gravity, Urine: 1.02 (ref 1.005–1.030)
pH: 6.5 (ref 5.0–8.0)

## 2021-10-12 LAB — COMPREHENSIVE METABOLIC PANEL
ALT: 20 U/L (ref 0–44)
AST: 34 U/L (ref 15–41)
Albumin: 4 g/dL (ref 3.5–5.0)
Alkaline Phosphatase: 45 U/L (ref 38–126)
Anion gap: 7 (ref 5–15)
BUN: 14 mg/dL (ref 8–23)
CO2: 26 mmol/L (ref 22–32)
Calcium: 8.9 mg/dL (ref 8.9–10.3)
Chloride: 104 mmol/L (ref 98–111)
Creatinine, Ser: 0.41 mg/dL — ABNORMAL LOW (ref 0.44–1.00)
GFR, Estimated: >60 mL/min (ref >60)
Glucose, Bld: 126 mg/dL — ABNORMAL HIGH (ref 70–99)
Potassium: 3.7 mmol/L (ref 3.5–5.1)
Sodium: 137 mmol/L (ref 135–145)
Total Bilirubin: 0.8 mg/dL (ref 0.3–1.2)
Total Protein: 6.8 g/dL (ref 6.5–8.1)

## 2021-10-12 LAB — URINALYSIS, MICROSCOPIC (REFLEX)
RBC / HPF: >50 RBC/hpf (ref 0–5)
Squamous Epithelial / HPF: NONE SEEN (ref 0–5)

## 2021-10-12 LAB — CBC
HCT: 36.8 % (ref 36.0–46.0)
Hemoglobin: 12.1 g/dL (ref 12.0–15.0)
MCH: 30.3 pg (ref 26.0–34.0)
MCHC: 32.9 g/dL (ref 30.0–36.0)
MCV: 92.2 fL (ref 80.0–100.0)
Platelets: 170 10*3/uL (ref 150–400)
RBC: 3.99 MIL/uL (ref 3.87–5.11)
RDW: 13.8 % (ref 11.5–15.5)
WBC: 7.2 10*3/uL (ref 4.0–10.5)
nRBC: 0 % (ref 0.0–0.2)

## 2021-10-12 LAB — TROPONIN I (HIGH SENSITIVITY)
Troponin I (High Sensitivity): 20 ng/L — ABNORMAL HIGH (ref <18)
Troponin I (High Sensitivity): 24 ng/L — ABNORMAL HIGH (ref <18)

## 2021-10-12 MED ORDER — LACTATED RINGERS IV BOLUS
1000.0000 mL | Freq: Once | INTRAVENOUS | Status: AC
  Administered 2021-10-12: 1000 mL via INTRAVENOUS

## 2021-10-12 MED ORDER — SODIUM CHLORIDE 0.9 % IV SOLN
1.0000 g | Freq: Once | INTRAVENOUS | Status: AC
  Administered 2021-10-13: 1 g via INTRAVENOUS
  Filled 2021-10-12: qty 10

## 2021-10-12 NOTE — ED Provider Notes (Signed)
Emergency Medicine Provider Triage Evaluation Note  Lindsey Burke , a 87 y.o. female  was evaluated in triage.  Pt complains of achiness rereduce the group she had full last night.  States that she drink 2 of her lorazepam last night.  Larey Seat was feeling weak and was on the ground since around midnight last night until EMS came and got her this morning..  Review of Systems  Positive: weakness Negative: Abdominal pain  Physical Exam  BP 120/80 (BP Location: Left Arm)   Pulse (!) 107   Temp 99 F (37.2 C)   Resp 18   Ht 5' (1.524 m)   Wt 36.3 kg   SpO2 96%   BMI 15.62 kg/m  Gen:   Awake, no distress   Resp:  Normal effort  MSK:   Moves extremities without difficulty  Other:    Medical Decision Making  Medically screening exam initiated at 2:24 PM.  Appropriate orders placed.  Lindsey Burke was informed that the remainder of the evaluation will be completed by another provider, this initial triage assessment does not replace that evaluation, and the importance of remaining in the ED until their evaluation is complete.     Willy Eddy, MD 10/12/21 1425

## 2021-10-12 NOTE — ED Notes (Signed)
Pts Sister Darel Hong) provided an update by the patients daughter.

## 2021-10-12 NOTE — ED Provider Notes (Signed)
[facility] Emergency Department Provider Note   ____________________________________________   Event Date/Time   First MD Initiated Contact with Patient 10/12/21 2206     (approximate)  I have reviewed the triage vital signs and the nursing notes.   HISTORY  Chief Complaint Weakness    HPI Lindsey Burke is a 87 y.o. female with past medical history of hyperlipidemia, COPD, GERD, and anxiety who presents to the ED complaining of weakness.  Patient reports that around 12 AM last night she accidentally took an extra dose of her 0.5 mg Ativan.  She states she usually takes this for her anxiety and sleep, but she felt extremely weak last night after taking the extra dose accidentally.  She states she had to lay down on the ground in her home and was unable to get up until about 4 AM.  At that time, she was able to call family and EMS was contacted.  EMS came to check on the patient, found her to have normal vital signs and was able to get her back up into bed.  Patient felt better but then again contacted family later this morning that she continued to feel weak and had pain in her chest.  Patient now states that her chest is feeling better and she denies any difficulty breathing.  She does state that she has been coughing recently but she denies any fevers, nausea, vomiting, abdominal pain, or dysuria.  Daughter is not aware of any sick contacts.        Past Medical History:  Diagnosis Date   Back pain    Cholelithiasis    COPD (chronic obstructive pulmonary disease) (HCC)    Gallstones    GERD (gastroesophageal reflux disease)    Hammertoe    Hepatitis    Hyperlipidemia    Kidney stones 1970   Macular degeneration    MVP (mitral valve prolapse)    Nephrolithiasis    Ovarian cyst 2015   Ovarian cyst     There are no problems to display for this patient.   Past Surgical History:  Procedure Laterality Date   AUGMENTATION MAMMAPLASTY Bilateral     bilateral implants   BREAST SURGERY     implants   colonscopy  2012   Unremarkable-Dr Mechele Collin   ESOPHAGOGASTRODUODENOSCOPY (EGD) WITH PROPOFOL N/A 06/18/2015   Procedure: ESOPHAGOGASTRODUODENOSCOPY (EGD) WITH PROPOFOL;  Surgeon: Scot Jun, MD;  Location: [facility] ENDOSCOPY;  Service: Endoscopy;  Laterality: N/A;   SAVORY DILATION N/A 06/18/2015   Procedure: SAVORY DILATION;  Surgeon: Scot Jun, MD;  Location: [facility] ENDOSCOPY;  Service: Endoscopy;  Laterality: N/A;    Prior to Admission medications   Medication Sig Start Date End Date Taking? Authorizing Provider  alendronate (FOSAMAX) 70 MG tablet Take 1 tablet by mouth once a week. 04/07/15   [provider]  aspirin EC 81 MG tablet Take 81 mg by mouth daily.    [provider]  Calcium Carbonate (OS-CAL PO) Take 1 tablet by mouth daily.    [provider]  gabapentin (NEURONTIN) 300 MG capsule Take 1 capsule by mouth at bedtime. 02/23/15   [provider]  LORazepam (ATIVAN) 0.5 MG tablet Take 1 tablet by mouth every 8 (eight) hours as needed. 03/23/15   [provider]  Multiple Vitamins-Minerals (PRESERVISION AREDS PO) Take 1 tablet by mouth daily.    [provider]  Omega-3 Fatty Acids (FISH OIL) 1000 MG CAPS Take 1 capsule by mouth daily.  [provider]  omeprazole (PRILOSEC) 20 MG capsule Take 20 mg by mouth daily.    [provider]    Allergies Augmentin [amoxicillin-pot clavulanate]  Family History  Problem Relation Age of Onset   Cancer Mother    Cholelithiasis Father    Tuberculosis Father    Cancer Father        Prostate and Larynx   Cholelithiasis Sister    Breast cancer Maternal Aunt     Social History Social History   Tobacco Use   Smoking status: Never   Smokeless tobacco: Never  Substance Use Topics   Alcohol use: Yes    Alcohol/week: 1.0 standard drink    Types: 1 Glasses of wine per week    Comment: occasional   Drug  use: No    Review of Systems  Constitutional: No fever/chills.  Positive for generalized weakness. Eyes: No visual changes. ENT: No sore throat. Cardiovascular: Positive for chest pain. Respiratory: Denies shortness of breath.  Positive for cough. Gastrointestinal: No abdominal pain.  No nausea, no vomiting.  No diarrhea.  No constipation. Genitourinary: Negative for dysuria. Musculoskeletal: Negative for back pain. Skin: Negative for rash. Neurological: Negative for headaches, focal weakness or numbness.  ____________________________________________   PHYSICAL EXAM:  VITAL SIGNS: ED Triage Vitals  Enc Vitals Group     BP 10/12/21 1422 120/80     Pulse Rate 10/12/21 1422 (!) 107     Resp 10/12/21 1422 18     Temp 10/12/21 1422 99 F (37.2 C)     Temp src --      SpO2 10/12/21 1422 96 %     Weight 10/12/21 1423 80 lb (36.3 kg)     Height 10/12/21 1423 5' (1.524 m)     Head Circumference --      Peak Flow --      Pain Score 10/12/21 1422 7     Pain Loc --      Pain Edu? --      Excl. in GC? --     Constitutional: Alert and oriented. Eyes: Conjunctivae are normal. Head: Atraumatic. Nose: No congestion/rhinnorhea. Mouth/Throat: Mucous membranes are moist. Neck: Normal ROM, no midline cervical spine tenderness to palpation. Cardiovascular: Normal rate, regular rhythm. Grossly normal heart sounds.  2+ radial pulses bilaterally. Respiratory: Normal respiratory effort.  No retractions. Lungs CTAB. Gastrointestinal: Soft and nontender. No distention. Genitourinary: deferred Musculoskeletal: No lower extremity tenderness nor edema.  No upper extremity bony tenderness to palpation. Neurologic:  Normal speech and language. No gross focal neurologic deficits are appreciated. Skin:  Skin is warm, dry and intact. No rash noted. Psychiatric: Mood and affect are normal. Speech and behavior are normal.  ____________________________________________   LABS (all labs ordered are  listed, but only abnormal results are displayed)  Labs Reviewed  COMPREHENSIVE METABOLIC PANEL - Abnormal; Notable for the following components:      Result Value   Glucose, Bld 126 (*)    Creatinine, Ser 0.41 (*)    All other components within normal limits  CK - Abnormal; Notable for the following components:   Total CK 248 (*)    All other components within normal limits  TROPONIN I (HIGH SENSITIVITY) - Abnormal; Notable for the following components:   Troponin I (High Sensitivity) 20 (*)    All other components within normal limits  TROPONIN I (HIGH SENSITIVITY) - Abnormal; Notable for the following components:   Troponin I (High Sensitivity) 24 (*)    All other  components within normal limits  RESP PANEL BY RT-PCR (FLU A&B, COVID) ARPGX2  CBC  URINALYSIS, ROUTINE W REFLEX MICROSCOPIC   ____________________________________________  EKG  ED ECG REPORT I, Chesley Noon, the attending physician, personally viewed and interpreted this ECG.   Date: 10/12/2021  EKG Time: 14:19  Rate: 104  Rhythm: sinus tachycardia  Axis: Normal  Intervals:none  ST&T Change: None   PROCEDURES  Procedure(s) performed (including Critical Care):  Procedures   ____________________________________________   INITIAL IMPRESSION / ASSESSMENT AND PLAN / ED COURSE     87 year old female with past medical history of hyperlipidemia, COPD, GERD, and anxiety presents to the ED with generalized weakness and chest discomfort after taking extra dose of Ativan overnight last night and laying on the floor for much of the evening.  Patient denies any falls, CT head was performed from triage and negative for acute process.  No evidence of traumatic injury to her trunk or extremities, CK level is mildly elevated.  Chest x-ray reviewed by me and shows no infiltrate, edema, or effusion.  With her cough and weakness, we will check testing for COVID-19 and influenza.  We will also hydrate with IV fluids and add  on UA.  Repeat troponin is pending, but if this is within normal limits and remainder of work-up is unremarkable, patient would be appropriate for discharge home with outpatient follow-up.      ____________________________________________   FINAL CLINICAL IMPRESSION(S) / ED DIAGNOSES  Final diagnoses:  Generalized weakness  Chest pain, unspecified type     ED Discharge Orders     None        Note:  This document was prepared using Dragon voice recognition software and may include unintentional dictation errors.    Chesley Noon, MD 10/12/21 2318

## 2021-10-12 NOTE — ED Triage Notes (Signed)
Pt comes into the ED via POV c/o weakness secondary to taking an extra ativan accidentally.  Pt states she takes lorazepam 0.5mg  normally.  Pt states she also took her gabapentin in addition to this.  Pt states she was so weak last night that she wasn't able to get up and then she started having chest pain.  Pt was on the floor from 12:00 until 3:00.  Pt in NAD at this time with even and unlabored respirations.

## 2021-10-13 DIAGNOSIS — U071 COVID-19: Secondary | ICD-10-CM | POA: Diagnosis present

## 2021-10-13 DIAGNOSIS — H3530 Unspecified macular degeneration: Secondary | ICD-10-CM | POA: Diagnosis present

## 2021-10-13 DIAGNOSIS — N390 Urinary tract infection, site not specified: Secondary | ICD-10-CM | POA: Diagnosis present

## 2021-10-13 DIAGNOSIS — R079 Chest pain, unspecified: Secondary | ICD-10-CM | POA: Diagnosis not present

## 2021-10-13 DIAGNOSIS — I248 Other forms of acute ischemic heart disease: Secondary | ICD-10-CM | POA: Diagnosis present

## 2021-10-13 DIAGNOSIS — R636 Underweight: Secondary | ICD-10-CM

## 2021-10-13 DIAGNOSIS — Z7982 Long term (current) use of aspirin: Secondary | ICD-10-CM | POA: Diagnosis not present

## 2021-10-13 DIAGNOSIS — B9620 Unspecified Escherichia coli [E. coli] as the cause of diseases classified elsewhere: Secondary | ICD-10-CM | POA: Diagnosis present

## 2021-10-13 DIAGNOSIS — F419 Anxiety disorder, unspecified: Secondary | ICD-10-CM | POA: Diagnosis present

## 2021-10-13 DIAGNOSIS — M6282 Rhabdomyolysis: Secondary | ICD-10-CM

## 2021-10-13 DIAGNOSIS — Z7983 Long term (current) use of bisphosphonates: Secondary | ICD-10-CM | POA: Diagnosis not present

## 2021-10-13 DIAGNOSIS — J449 Chronic obstructive pulmonary disease, unspecified: Secondary | ICD-10-CM

## 2021-10-13 DIAGNOSIS — R1319 Other dysphagia: Secondary | ICD-10-CM | POA: Diagnosis not present

## 2021-10-13 DIAGNOSIS — R778 Other specified abnormalities of plasma proteins: Secondary | ICD-10-CM

## 2021-10-13 DIAGNOSIS — Z79899 Other long term (current) drug therapy: Secondary | ICD-10-CM | POA: Diagnosis not present

## 2021-10-13 DIAGNOSIS — E785 Hyperlipidemia, unspecified: Secondary | ICD-10-CM | POA: Diagnosis present

## 2021-10-13 DIAGNOSIS — K219 Gastro-esophageal reflux disease without esophagitis: Secondary | ICD-10-CM | POA: Diagnosis present

## 2021-10-13 DIAGNOSIS — K222 Esophageal obstruction: Secondary | ICD-10-CM | POA: Diagnosis present

## 2021-10-13 DIAGNOSIS — M549 Dorsalgia, unspecified: Secondary | ICD-10-CM | POA: Diagnosis present

## 2021-10-13 DIAGNOSIS — N3001 Acute cystitis with hematuria: Secondary | ICD-10-CM | POA: Diagnosis present

## 2021-10-13 DIAGNOSIS — R1314 Dysphagia, pharyngoesophageal phase: Secondary | ICD-10-CM | POA: Diagnosis present

## 2021-10-13 DIAGNOSIS — Z87442 Personal history of urinary calculi: Secondary | ICD-10-CM | POA: Diagnosis not present

## 2021-10-13 DIAGNOSIS — Z881 Allergy status to other antibiotic agents status: Secondary | ICD-10-CM | POA: Diagnosis not present

## 2021-10-13 DIAGNOSIS — R531 Weakness: Secondary | ICD-10-CM | POA: Diagnosis not present

## 2021-10-13 DIAGNOSIS — Z681 Body mass index (BMI) 19 or less, adult: Secondary | ICD-10-CM | POA: Diagnosis not present

## 2021-10-13 LAB — FOLATE: Folate: 28 ng/mL (ref >5.9)

## 2021-10-13 LAB — PHOSPHORUS: Phosphorus: 2.2 mg/dL — ABNORMAL LOW (ref 2.5–4.6)

## 2021-10-13 LAB — BASIC METABOLIC PANEL
Anion gap: 9 (ref 5–15)
BUN: 11 mg/dL (ref 8–23)
CO2: 22 mmol/L (ref 22–32)
Calcium: 8 mg/dL — ABNORMAL LOW (ref 8.9–10.3)
Chloride: 108 mmol/L (ref 98–111)
Creatinine, Ser: 0.39 mg/dL — ABNORMAL LOW (ref 0.44–1.00)
GFR, Estimated: >60 mL/min (ref >60)
Glucose, Bld: 78 mg/dL (ref 70–99)
Potassium: 3.5 mmol/L (ref 3.5–5.1)
Sodium: 139 mmol/L (ref 135–145)

## 2021-10-13 LAB — MAGNESIUM: Magnesium: 1.8 mg/dL (ref 1.7–2.4)

## 2021-10-13 LAB — CBC
HCT: 35.4 % — ABNORMAL LOW (ref 36.0–46.0)
Hemoglobin: 11.4 g/dL — ABNORMAL LOW (ref 12.0–15.0)
MCH: 29.4 pg (ref 26.0–34.0)
MCHC: 32.2 g/dL (ref 30.0–36.0)
MCV: 91.2 fL (ref 80.0–100.0)
Platelets: 160 10*3/uL (ref 150–400)
RBC: 3.88 MIL/uL (ref 3.87–5.11)
RDW: 14.2 % (ref 11.5–15.5)
WBC: 5.4 10*3/uL (ref 4.0–10.5)
nRBC: 0 % (ref 0.0–0.2)

## 2021-10-13 LAB — IRON AND TIBC
Iron: 13 ug/dL — ABNORMAL LOW (ref 28–170)
Saturation Ratios: 5 % — ABNORMAL LOW (ref 10.4–31.8)
TIBC: 249 ug/dL — ABNORMAL LOW (ref 250–450)
UIBC: 236 ug/dL

## 2021-10-13 LAB — VITAMIN D 25 HYDROXY (VIT D DEFICIENCY, FRACTURES): Vit D, 25-Hydroxy: 82.51 ng/mL (ref 30–100)

## 2021-10-13 LAB — RESP PANEL BY RT-PCR (FLU A&B, COVID) ARPGX2
Influenza A by PCR: NEGATIVE
Influenza B by PCR: NEGATIVE
SARS Coronavirus 2 by RT PCR: POSITIVE — AB

## 2021-10-13 LAB — VITAMIN B12: Vitamin B-12: 2079 pg/mL — ABNORMAL HIGH (ref 180–914)

## 2021-10-13 LAB — CREATININE, SERUM
Creatinine, Ser: 0.37 mg/dL — ABNORMAL LOW (ref 0.44–1.00)
GFR, Estimated: >60 mL/min (ref >60)

## 2021-10-13 MED ORDER — SODIUM CHLORIDE 0.9 % IV SOLN
100.0000 mg | Freq: Every day | INTRAVENOUS | Status: AC
  Administered 2021-10-14 – 2021-10-15 (×2): 100 mg via INTRAVENOUS
  Filled 2021-10-13 (×2): qty 100

## 2021-10-13 MED ORDER — ENOXAPARIN SODIUM 30 MG/0.3ML IJ SOSY
30.0000 mg | PREFILLED_SYRINGE | INTRAMUSCULAR | Status: DC
  Administered 2021-10-13 – 2021-10-15 (×3): 30 mg via SUBCUTANEOUS
  Filled 2021-10-13 (×4): qty 0.3

## 2021-10-13 MED ORDER — ZINC SULFATE 220 (50 ZN) MG PO CAPS
220.0000 mg | ORAL_CAPSULE | Freq: Two times a day (BID) | ORAL | Status: DC
  Administered 2021-10-13 – 2021-10-16 (×6): 220 mg via ORAL
  Filled 2021-10-13 (×6): qty 1

## 2021-10-13 MED ORDER — K PHOS MONO-SOD PHOS DI & MONO 155-852-130 MG PO TABS
500.0000 mg | ORAL_TABLET | Freq: Four times a day (QID) | ORAL | Status: AC
  Administered 2021-10-14 – 2021-10-15 (×5): 500 mg via ORAL
  Filled 2021-10-13 (×9): qty 2

## 2021-10-13 MED ORDER — ACETAMINOPHEN 325 MG PO TABS
650.0000 mg | ORAL_TABLET | Freq: Four times a day (QID) | ORAL | Status: DC | PRN
  Administered 2021-10-13 – 2021-10-16 (×2): 650 mg via ORAL
  Filled 2021-10-13 (×2): qty 2

## 2021-10-13 MED ORDER — SODIUM CHLORIDE 0.9 % IV SOLN
1.0000 g | INTRAVENOUS | Status: DC
  Administered 2021-10-13 – 2021-10-15 (×3): 1 g via INTRAVENOUS
  Filled 2021-10-13: qty 10
  Filled 2021-10-13 (×2): qty 1
  Filled 2021-10-13: qty 10
  Filled 2021-10-13: qty 1

## 2021-10-13 MED ORDER — GUAIFENESIN-DM 100-10 MG/5ML PO SYRP: 10.0000 mL | ORAL_SOLUTION | ORAL | Status: DC | PRN

## 2021-10-13 MED ORDER — SODIUM CHLORIDE 0.9 % IV SOLN: INTRAVENOUS | Status: DC

## 2021-10-13 MED ORDER — ONDANSETRON HCL 4 MG/2ML IJ SOLN: 4.0000 mg | Freq: Four times a day (QID) | INTRAMUSCULAR | Status: DC | PRN

## 2021-10-13 MED ORDER — ASCORBIC ACID 500 MG PO TABS
500.0000 mg | ORAL_TABLET | Freq: Every day | ORAL | Status: DC
  Administered 2021-10-13 – 2021-10-16 (×4): 500 mg via ORAL
  Filled 2021-10-13 (×4): qty 1

## 2021-10-13 MED ORDER — CEPHALEXIN 500 MG PO CAPS: 500.0000 mg | ORAL_CAPSULE | Freq: Three times a day (TID) | ORAL | 0 refills | Status: DC

## 2021-10-13 MED ORDER — POLYSACCHARIDE IRON COMPLEX 150 MG PO CAPS
150.0000 mg | ORAL_CAPSULE | Freq: Every day | ORAL | Status: DC
  Administered 2021-10-14 – 2021-10-16 (×3): 150 mg via ORAL
  Filled 2021-10-13 (×4): qty 1

## 2021-10-13 MED ORDER — ONDANSETRON HCL 4 MG PO TABS: 4.0000 mg | ORAL_TABLET | Freq: Four times a day (QID) | ORAL | Status: DC | PRN

## 2021-10-13 MED ORDER — ADULT MULTIVITAMIN W/MINERALS CH
1.0000 | ORAL_TABLET | Freq: Every day | ORAL | Status: DC
  Administered 2021-10-13 – 2021-10-16 (×4): 1 via ORAL
  Filled 2021-10-13 (×4): qty 1

## 2021-10-13 MED ORDER — ACETAMINOPHEN 650 MG RE SUPP: 650.0000 mg | Freq: Four times a day (QID) | RECTAL | Status: DC | PRN

## 2021-10-13 MED ORDER — FOLIC ACID 1 MG PO TABS
1.0000 mg | ORAL_TABLET | Freq: Every day | ORAL | Status: DC
  Administered 2021-10-13 – 2021-10-16 (×4): 1 mg via ORAL
  Filled 2021-10-13 (×4): qty 1

## 2021-10-13 MED ORDER — SODIUM CHLORIDE 0.9 % IV SOLN
200.0000 mg | Freq: Once | INTRAVENOUS | Status: AC
  Administered 2021-10-13: 200 mg via INTRAVENOUS
  Filled 2021-10-13: qty 40

## 2021-10-13 NOTE — Progress Notes (Signed)
Anticoagulation monitoring(Lovenox):  87 yo female ordered Lovenox 40 mg Q24h    Filed Weights   10/12/21 1423  Weight: 36.3 kg (80 lb)   BMI 15.6   Lab Results  Component Value Date   CREATININE 0.41 (L) 10/12/2021   CREATININE 0.59 (L) 05/20/2014   CREATININE 0.53 (L) 09/29/2012   Estimated Creatinine Clearance: 29.5 mL/min (A) (by C-G formula based on SCr of 0.41 mg/dL (L)). Hemoglobin & Hematocrit     Component Value Date/Time   HGB 12.1 10/12/2021 1424   HGB 14.3 09/29/2012 1430   HCT 36.8 10/12/2021 1424   HCT 41.6 09/29/2012 1430     Per Protocol for Patient with estCrcl > 30 ml/min and BMI > 30, will transition to Lovenox 30 mg Q24h.

## 2021-10-13 NOTE — ED Notes (Signed)
Informed RN bed assigned 

## 2021-10-13 NOTE — ED Provider Notes (Addendum)
Covid 19 + and UTI. Patient very weak. Daughter does not feel like patient is safe for dc. Will consult hospitalist for admission    Lindsey Perking [place], MD 10/13/21 0040

## 2021-10-13 NOTE — ED Notes (Signed)
Resumed care from elana rn, pt sleeping  family with pt.  Pt waiting on bed assignment.

## 2021-10-13 NOTE — H&P (Signed)
History and Physical    Lindsey Burke ZOX:[id number] DOB: May 21, 1934 DOA: 10/12/2021  PCP: Jaclyn Shaggy, MD   Patient coming from: home  I have personally briefly reviewed patient's relevant medical records in [facility]  Chief Complaint: weakness  HPI: Lindsey Burke is a 87 y.o. female with medical history significant for COPD and who lives independently who was in her usual state of health until 2 days prior when she was noted to be very weak to where she is herself onto the floor and could not get up for several hours.  She was able to eventually called her daughter who called rescue and they were able to help her up.  On the day of arrival she continued to be very weak to where she could not get out of bed.  They eventually decided to come into the ED.  She admits to recently having a cough but denies shortness of breath, fever, nausea, vomiting, abdominal pain or diarrhea.  She denies chest pain.  ED course: Tachycardic at 107 with a low-grade temp of 99 with otherwise normal vitals Blood work significant for normal WBC and hemoglobin Troponin 20-24 Total CK2 48 CBC and CMP mostly unremarkable UA with greater than 160 ketones positive nitrites and many bacteria COVID-positive  EKG, personally viewed and interpreted: Sinus tachycardia at 104 with no acute ST-T wave changes  Imaging: CT head nonacute, chest x-ray with no edema infiltrate or effusion  Patient treated with an LR bolus and given Rocephin.  Hospitalist consulted for admission   Review of Systems: As per HPI otherwise all other systems on review of systems negative.   Assessment/Plan    Generalized weakness -Secondary to acute illness and poor oral intake - IV hydration - Nutritionist and PT eval    UTI (urinary tract infection) - IV Rocephin - Follow cultures    COVID-19 virus infection - Chest x-ray clear and O2 sat normal on room air - Supportive care - Suspecting incidental     Rhabdomyolysis - CK2 48 - IV hydration    Elevated troponin - Troponin 20-24 and likely related to demand ischemia - Patient denies chest pain and EKG is nonacute    COPD (chronic obstructive pulmonary disease) (HCC) - Not acutely exacerbated - DuoNeb as needed    Underweight - Nutritionist evaluation   DVT prophylaxis: Lovenox  Code Status: full code  Family Communication:  none  Disposition Plan: Back to previous home environment Consults called: none  Status:At the time of admission, it appears that the appropriate admission status for this patient is INPATIENT. This is judged to be reasonable and necessary in order to provide the required intensity of service to ensure the patient's safety given the presenting symptoms, physical exam findings, and initial radiographic and laboratory data in the context of their  Comorbid conditions.   Patient requires inpatient status due to high intensity of service, high risk for further deterioration and high frequency of surveillance required.   I certify that at the point of admission it is my clinical judgment that the patient will require inpatient hospital care spanning beyond 2 midnights     Physical Exam: Vitals:   10/12/21 2230 10/12/21 2300 10/13/21 0000 10/13/21 0030  BP: (!) 146/79 (!) 141/67 137/64 133/69  Pulse: 99 100 93 96  Resp: Temp:      SpO2: 100% 98% 97% 97%  Weight:      Height:  Constitutional: Frail-appearing elderly, oriented x 3 . Not in any apparent distress HEENT:      Head: Normocephalic and atraumatic.         Eyes: PERLA, EOMI, Conjunctivae are normal. Sclera is non-icteric.       Mouth/Throat: Mucous membranes are moist.       Neck: Supple with no signs of meningismus. Cardiovascular: Regular rate and rhythm. No murmurs, gallops, or rubs. 2+ symmetrical distal pulses are present . No JVD. No  LE edema Respiratory: Respiratory effort normal .Lungs sounds clear bilaterally. No  wheezes, crackles, or rhonchi.  Gastrointestinal: Soft, non tender, non distended. Positive bowel sounds.  Genitourinary: No CVA tenderness. Musculoskeletal: Nontender with normal range of motion in all extremities. No cyanosis, or erythema of extremities. Neurologic:  Face is symmetric. Moving all extremities. No gross focal neurologic deficits . Skin: Skin is warm, dry.  No rash or ulcers Psychiatric: Mood and affect are appropriate     Past Medical History:  Diagnosis Date   Back pain    Cholelithiasis    COPD (chronic obstructive pulmonary disease) (HCC)    Gallstones    GERD (gastroesophageal reflux disease)    Hammertoe    Hepatitis    Hyperlipidemia    Kidney stones 1970   Macular degeneration    MVP (mitral valve prolapse)    Nephrolithiasis    Ovarian cyst 2015   Ovarian cyst     Past Surgical History:  Procedure Laterality Date   AUGMENTATION MAMMAPLASTY Bilateral    bilateral implants   BREAST SURGERY     implants   colonscopy  2012   Unremarkable-Dr Mechele Collin   ESOPHAGOGASTRODUODENOSCOPY (EGD) WITH PROPOFOL N/A 06/18/2015   Procedure: ESOPHAGOGASTRODUODENOSCOPY (EGD) WITH PROPOFOL;  Surgeon: Scot Jun, MD;  Location: [facility] ENDOSCOPY;  Service: Endoscopy;  Laterality: N/A;   SAVORY DILATION N/A 06/18/2015   Procedure: SAVORY DILATION;  Surgeon: Scot Jun, MD;  Location: [facility] ENDOSCOPY;  Service: Endoscopy;  Laterality: N/A;     reports that she has never smoked. She has never used smokeless tobacco. She reports current alcohol use of about 1.0 standard drink per week. She reports that she does not use drugs.  Allergies  Allergen Reactions   Augmentin [Amoxicillin-Pot Clavulanate] Other (See Comments)    Elevated Liver Enzymes    Family History  Problem Relation Age of Onset   Cancer Mother    Cholelithiasis Father    Tuberculosis Father    Cancer Father        Prostate and Larynx   Cholelithiasis Sister    Breast cancer Maternal Aunt        Prior to Admission medications   Medication Sig Start Date End Date Taking? Authorizing Provider  alendronate (FOSAMAX) 70 MG tablet Take 1 tablet by mouth once a week. 04/07/15  Yes [provider]  aspirin EC 81 MG tablet Take 81 mg by mouth daily.   Yes [provider]  Calcium Carbonate (OS-CAL PO) Take 1 tablet by mouth daily.   Yes [provider]  cephALEXin (KEFLEX) 500 MG capsule Take 1 capsule (500 mg total) by mouth 3 (three) times daily for 7 days. 10/13/21 10/20/21 Yes Veronese, [place], MD  gabapentin (NEURONTIN) 300 MG capsule Take 1 capsule by mouth at bedtime. 02/23/15  Yes [provider]  LORazepam (ATIVAN) 0.5 MG tablet Take 1 tablet by mouth every 8 (eight) hours as needed. 03/23/15  Yes [provider]  Multiple Vitamins-Minerals (PRESERVISION AREDS PO) Take 1 tablet  by mouth daily.   Yes [provider]  Omega-3 Fatty Acids (FISH OIL) 1000 MG CAPS Take 1 capsule by mouth daily.   Yes [provider]  omeprazole (PRILOSEC) 20 MG capsule Take 20 mg by mouth daily.   Yes [provider]      Labs on Admission: I have personally reviewed following labs and imaging studies  CBC: Recent Labs  Lab 10/12/21 1424  WBC 7.2  HGB 12.1  HCT 36.8  MCV 92.2  PLT 170   Basic Metabolic Panel: Recent Labs  Lab 10/12/21 1424  NA 137  K 3.7  CL 104  CO2 26  GLUCOSE 126*  BUN 14  CREATININE 0.41*  CALCIUM 8.9   GFR: Estimated Creatinine Clearance: 29.5 mL/min (A) (by C-G formula based on SCr of 0.41 mg/dL (L)). Liver Function Tests: Recent Labs  Lab 10/12/21 1424  AST 34  ALT 20  ALKPHOS 45  BILITOT 0.8  PROT 6.8  ALBUMIN 4.0   No results for input(s): LIPASE, AMYLASE in the last 168 hours. No results for input(s): AMMONIA in the last 168 hours. Coagulation Profile: No results for input(s): INR, PROTIME in the last 168 hours. Cardiac Enzymes: Recent Labs  Lab 10/12/21 1424  CKTOTAL  248*   BNP (last 3 results) No results for input(s): PROBNP in the last 8760 hours. HbA1C: No results for input(s): HGBA1C in the last 72 hours. CBG: No results for input(s): GLUCAP in the last 168 hours. Lipid Profile: No results for input(s): CHOL, HDL, LDLCALC, TRIG, CHOLHDL, LDLDIRECT in the last 72 hours. Thyroid Function Tests: No results for input(s): TSH, T4TOTAL, FREET4, T3FREE, THYROIDAB in the last 72 hours. Anemia Panel: No results for input(s): VITAMINB12, FOLATE, FERRITIN, TIBC, IRON, RETICCTPCT in the last 72 hours. Urine analysis:    Component Value Date/Time   COLORURINE YELLOW 10/12/2021 1424   APPEARANCEUR CLEAR 10/12/2021 1424   LABSPEC 1.020 10/12/2021 1424   PHURINE 6.5 10/12/2021 1424   GLUCOSEU NEGATIVE 10/12/2021 1424   HGBUR LARGE (A) 10/12/2021 1424   BILIRUBINUR NEGATIVE 10/12/2021 1424   KETONESUR >160 (A) 10/12/2021 1424   PROTEINUR 100 (A) 10/12/2021 1424   NITRITE POSITIVE (A) 10/12/2021 1424   LEUKOCYTESUR TRACE (A) 10/12/2021 1424    Radiological Exams on Admission: CT HEAD WO CONTRAST ( )  Result Date: 10/12/2021 CLINICAL DATA:  A female at age 57 presents for evaluation of middle status changes of unknown cause. EXAM: CT HEAD WITHOUT CONTRAST TECHNIQUE: Contiguous axial images were obtained from the base of the skull through the vertex without intravenous contrast. COMPARISON:  Is comparison made with Mar 13, 2021. FINDINGS: Brain: No evidence of acute infarction, hemorrhage, hydrocephalus, extra-axial collection or mass lesion/mass effect. Signs of atrophy as before. Vascular: No hyperdense vessel or unexpected calcification. Skull: Normal. Negative for fracture or focal lesion. Sinuses/Orbits: Visualized paranasal sinuses and orbits show no acute process. Other: None IMPRESSION: No acute intracranial pathology. Signs of atrophy as before. Electronically Signed   By: Donzetta Kohut M.D.   On: 10/12/2021 14:49   DG Chest Portable 1  View  Result Date: 10/12/2021 CLINICAL DATA:  Weakness. EXAM: PORTABLE CHEST 1 VIEW COMPARISON:  Chest x-ray dated October 11, 2012. FINDINGS: The heart size and mediastinal contours are within normal limits. Normal pulmonary vascularity. The lungs remain hyperinflated. No focal consolidation, pleural effusion, or pneumothorax. No acute osseous abnormality. IMPRESSION: No active disease. Electronically Signed   By: Obie Dredge M.D.   On: 10/12/2021 16:48  Andris Baumann MD [facility]   10/13/2021, 1:22 AM

## 2021-10-13 NOTE — ED Notes (Signed)
Pt. Repositioned in bed

## 2021-10-13 NOTE — Progress Notes (Addendum)
[facility] Progress Note  Patient: Lindsey Burke    NLZ:[id number]  DOA: 10/12/2021     Date of Service: the patient was seen and examined on 10/13/2021  Chief Complaint  Patient presents with   Weakness   Brief hospital course: Lindsey Burke is a 87 y.o. female with medical history significant for COPD and who lives independently who was in her usual state of health until 2 days prior when she was noted to be very weak to where she is herself onto the floor and could not get up for several hours.  She was able to eventually called her daughter who called rescue and they were able to help her up.  On the day of arrival she continued to be very weak to where she could not get out of bed.  They eventually decided to come into the ED.  She admits to recently having a cough but denies shortness of breath, fever, nausea, vomiting, abdominal pain or diarrhea.  She denies chest pain.   ED course: Tachycardic at 107 with a low-grade temp of 99 with otherwise normal vitals Blood work significant for normal WBC and hemoglobin Troponin 20-24 Total CK 248 CBC and CMP mostly unremarkable UA with greater than 160 ketones positive nitrites and many bacteria COVID-positive   EKG, personally viewed and interpreted: Sinus tachycardia at 104 with no acute ST-T wave changes   Imaging: CT head nonacute, chest x-ray with no edema infiltrate or effusion   Patient treated with an LR bolus and given Rocephin.  Hospitalist consulted for admission     Assessment and Plan: Generalized weakness -Secondary to acute illness and poor oral intake - IV hydration - Nutritionist and PT eval CK 248 slightly elevated, we will continue to monitor,    UTI (urinary tract infection) - IV Rocephin - Follow cultures     COVID-19 virus infection - Chest x-ray clear and O2 sat normal on room air - Supportive care - Suspecting incidental Patient was started on remdesivir x3 doses, added vitamin C and  zinc supplement      Elevated troponin - Troponin 20-24 and likely related to demand ischemia - Patient denies chest pain and EKG is nonacute Follow 2D echocardiogram     COPD (chronic obstructive pulmonary disease) (HCC) - Not acutely exacerbated - DuoNeb as needed     Underweight - Nutritionist evaluation  Iron deficiency, started oral iron supplement with vitamin C Folic acid within normal range,  vitamin B12 and vitamin D levels are pending  Body mass index is 15.62 kg/m.   Interventions:    Diet: Heart healthy DVT Prophylaxis: Subcutaneous Lovenox   Advance goals of care discussion: Full code  Family Communication: family was present at bedside, at the time of interview.  The pt provided permission to discuss medical plan with the family. Opportunity was given to ask question and all questions were answered satisfactorily.   Disposition:  Pt is from Home, admitted with weakness and fall, found to have UTI and COVID, still has weakness and high risk for fall, which precludes a safe discharge. Discharge to home pending PT eval, when clinically improves.  Subjective: Patient was admitted overnight due to weakness and fall, found to have UTI, denies any urinary symptoms at this time.  Patient was complaining of chest pressure on deep breathing, has upper respiratory symptoms, denied any other active issues.  Patient is still feeling generalized weakness  Physical Exam: General:  alert oriented to time, place, and person.  Appear in mild distress, affect appropriate Eyes: PERRLA ENT: Oral Mucosa Clear, moist  Neck: no JVD,  Cardiovascular: S1 and S2 Present, no Murmur,  Respiratory: good respiratory effort, Bilateral Air entry equal and Decreased, no Crackles, no wheezes Abdomen: Bowel Sound present, Soft and no tenderness,  Skin: no rashes  Extremities: no Pedal edema, no calf tenderness Neurologic: without any new focal findings Gait not checked due to patient  safety concerns  Vitals:   10/13/21 1445 10/13/21 1500 10/13/21 1515 10/13/21 1530  BP:  (!) 149/71  138/80  Pulse: 95 94 88 95  Resp:      Temp:      SpO2: 96% 98% 99% 96%  Weight:      Height:        Intake/Output Summary (Last 24 hours) at 10/13/2021 1532 Last data filed at 10/13/2021 1300 Gross per 24 hour  Intake 1606.93 ml  Output 700 ml  Net 906.93 ml   Filed Weights   10/12/21 1423  Weight: 36.3 kg    Data Reviewed: I have personally reviewed and interpreted daily labs, tele strips, imagings as discussed above. I reviewed all nursing notes, pharmacy notes, vitals, pertinent old records I have discussed plan of care as described above with RN and patient/family.  CBC: Recent Labs  Lab 10/12/21 1424 10/13/21 0739  WBC 7.2 5.4  HGB 12.1 11.4*  HCT 36.8 35.4*  MCV 92.2 91.2  PLT 170 160   Basic Metabolic Panel: Recent Labs  Lab 10/12/21 1424 10/13/21 0739  NA 137 139  K 3.7 3.5  CL 104 108  CO2 26 22  GLUCOSE 126* 78  BUN 14 11  CREATININE 0.41* 0.39*  0.37*  CALCIUM 8.9 8.0*  MG  --  1.8  PHOS  --  2.2*    Studies: DG Chest Portable 1 View  Result Date: 10/12/2021 CLINICAL DATA:  Weakness. EXAM: PORTABLE CHEST 1 VIEW COMPARISON:  Chest x-ray dated October 11, 2012. FINDINGS: The heart size and mediastinal contours are within normal limits. Normal pulmonary vascularity. The lungs remain hyperinflated. No focal consolidation, pleural effusion, or pneumothorax. No acute osseous abnormality. IMPRESSION: No active disease. Electronically Signed   By: Obie Dredge M.D.   On: 10/12/2021 16:48    Scheduled Meds:  enoxaparin (LOVENOX) injection  30 mg Subcutaneous Q24H   folic acid  1 mg Oral Daily   multivitamin with minerals  1 tablet Oral Daily   Continuous Infusions:  sodium chloride 100 mL/hr at 10/13/21 0208   [START ON 10/14/2021] cefTRIAXone (ROCEPHIN)  IV     [START ON 10/14/2021] remdesivir 100 mg in NS 100 mL     PRN Meds: acetaminophen  **OR** acetaminophen, guaiFENesin-dextromethorphan, ondansetron **OR** ondansetron (ZOFRAN) IV  Time spent: 35 minutes  Author: Gillis Santa. MD [facility] 10/13/2021 3:32 PM  To reach On-call, see care teams to locate the attending and reach out to them via [email or web address]. If 7PM-7AM, please contact night-coverage If you still have difficulty reaching the attending provider, please page the [facility] (Director on Call) for [facility] on amion for assistance.

## 2021-10-14 ENCOUNTER — Inpatient Hospital Stay: Payer: Medicare Other

## 2021-10-14 ENCOUNTER — Inpatient Hospital Stay (HOSPITAL_COMMUNITY)
Admit: 2021-10-14 | Discharge: 2021-10-14 | Disposition: A | Discharge status: Home / Self Care | Payer: Medicare Other | Attending: Student | Admitting: Student

## 2021-10-14 DIAGNOSIS — R079 Chest pain, unspecified: Secondary | ICD-10-CM

## 2021-10-14 DIAGNOSIS — R1310 Dysphagia, unspecified: Secondary | ICD-10-CM

## 2021-10-14 LAB — CBC
HCT: 35.6 % — ABNORMAL LOW (ref 36.0–46.0)
Hemoglobin: 11.6 g/dL — ABNORMAL LOW (ref 12.0–15.0)
MCH: 29.5 pg (ref 26.0–34.0)
MCHC: 32.6 g/dL (ref 30.0–36.0)
MCV: 90.6 fL (ref 80.0–100.0)
Platelets: 168 K/uL (ref 150–400)
RBC: 3.93 MIL/uL (ref 3.87–5.11)
RDW: 13.6 % (ref 11.5–15.5)
WBC: 4.9 K/uL (ref 4.0–10.5)
nRBC: 0 % (ref 0.0–0.2)

## 2021-10-14 LAB — BASIC METABOLIC PANEL WITH GFR
Anion gap: 12 (ref 5–15)
BUN: 10 mg/dL (ref 8–23)
CO2: 19 mmol/L — ABNORMAL LOW (ref 22–32)
Calcium: 8 mg/dL — ABNORMAL LOW (ref 8.9–10.3)
Chloride: 108 mmol/L (ref 98–111)
Creatinine, Ser: 0.36 mg/dL — ABNORMAL LOW (ref 0.44–1.00)
GFR, Estimated: >60 mL/min
Glucose, Bld: 79 mg/dL (ref 70–99)
Potassium: 3.5 mmol/L (ref 3.5–5.1)
Sodium: 139 mmol/L (ref 135–145)

## 2021-10-14 LAB — ECHOCARDIOGRAM COMPLETE
AR max vel: 2.83 cm2
AV Area VTI: 2.64 cm2
AV Area mean vel: 2.8 cm2
AV Mean grad: 4 mmHg
AV Peak grad: 7.1 mmHg
Ao pk vel: 1.33 m/s
Area-P 1/2: 4.83 cm2
Calc EF: 51.3 %
Height: 60 in
MV VTI: 3.58 cm2
S' Lateral: 2.2 cm
Single Plane A2C EF: 54 %
Single Plane A4C EF: 47.4 %
Weight: 1280 [oz_av]

## 2021-10-14 LAB — HEPATIC FUNCTION PANEL
ALT: 20 U/L (ref 0–44)
AST: 32 U/L (ref 15–41)
Albumin: 3.1 g/dL — ABNORMAL LOW (ref 3.5–5.0)
Alkaline Phosphatase: 40 U/L (ref 38–126)
Bilirubin, Direct: <0.1 mg/dL (ref 0.0–0.2)
Total Bilirubin: 0.7 mg/dL (ref 0.3–1.2)
Total Protein: 5.6 g/dL — ABNORMAL LOW (ref 6.5–8.1)

## 2021-10-14 LAB — D-DIMER, QUANTITATIVE: D-Dimer, Quant: 1.22 ug/mL-FEU — ABNORMAL HIGH (ref 0.00–0.50)

## 2021-10-14 LAB — PHOSPHORUS: Phosphorus: 2.2 mg/dL — ABNORMAL LOW (ref 2.5–4.6)

## 2021-10-14 LAB — C-REACTIVE PROTEIN: CRP: 5.4 mg/dL — ABNORMAL HIGH

## 2021-10-14 LAB — MAGNESIUM: Magnesium: 1.8 mg/dL (ref 1.7–2.4)

## 2021-10-14 LAB — CK: Total CK: 225 U/L (ref 38–234)

## 2021-10-14 MED ORDER — SODIUM CHLORIDE 0.9 % IV SOLN: INTRAVENOUS | Status: AC

## 2021-10-14 MED ORDER — PANTOPRAZOLE SODIUM 40 MG IV SOLR
40.0000 mg | Freq: Two times a day (BID) | INTRAVENOUS | Status: DC
  Administered 2021-10-14 – 2021-10-15 (×4): 40 mg via INTRAVENOUS
  Filled 2021-10-14 (×4): qty 40

## 2021-10-14 MED ORDER — HYDROCODONE BIT-HOMATROP MBR 5-1.5 MG/5ML PO SOLN: 5.0000 mL | Freq: Four times a day (QID) | ORAL | Status: DC | PRN

## 2021-10-14 NOTE — Progress Notes (Signed)
[facility] Progress Note  Patient: Lindsey Burke    OFB:[id number]  DOA: 10/12/2021     Date of Service: the patient was seen and examined on 10/14/2021  Chief Complaint  Patient presents with   Weakness   Brief hospital course: Lindsey Burke is a 87 y.o. female with medical history significant for COPD and who lives independently who was in her usual state of health until 2 days prior when she was noted to be very weak to where she is herself onto the floor and could not get up for several hours.  She was able to eventually called her daughter who called rescue and they were able to help her up.  On the day of arrival she continued to be very weak to where she could not get out of bed.  They eventually decided to come into the ED.  She admits to recently having a cough but denies shortness of breath, fever, nausea, vomiting, abdominal pain or diarrhea.  She denies chest pain.   ED course: Tachycardic at 107 with a low-grade temp of 99 with otherwise normal vitals Blood work significant for normal WBC and hemoglobin Troponin 20-24 Total CK 248 CBC and CMP mostly unremarkable UA with greater than 160 ketones positive nitrites and many bacteria COVID-positive   EKG, personally viewed and interpreted: Sinus tachycardia at 104 with no acute ST-T wave changes   Imaging: CT head nonacute, chest x-ray with no edema infiltrate or effusion   Patient treated with an LR bolus and given Rocephin.  Hospitalist consulted for admission     Assessment and Plan:  Generalized weakness -Secondary to acute illness and poor oral intake Continue fall precautions - IV hydration - Nutritionist and PT eval CK 248 slightly elevated, we will continue to monitor,  E. coli UTI (urinary tract infection) - IV Rocephin - Follow cultures growing E. coli, sensitivity pending     COVID-19 virus infection - Chest x-ray clear and O2 sat normal on room air - Supportive care - Suspecting  incidental Patient was started on remdesivir x3 doses, added vitamin C and zinc supplement      Elevated troponin - Troponin 20-24 and likely related to demand ischemia - Patient denies chest pain and EKG is nonacute Follow 2D echocardiogram  Dysphagia Continue aspiration precautions SLP eval pending Esophagogram pending Follow GI consult for possible EGD      COPD (chronic obstructive pulmonary disease) (HCC) - Not acutely exacerbated - DuoNeb as needed     Underweight - Nutritionist evaluation  Iron deficiency, started oral iron supplement with vitamin C Folic acid within normal range,  vitamin B12 and vitamin D levels are pending  Hypophosphatemia most likely due to poor nutrition Phosphorus repleted.  Body mass index is 15.62 kg/m.   Interventions:   Follow PT and OT eval  Diet: Heart healthy DVT Prophylaxis: Subcutaneous Lovenox   Advance goals of care discussion: Full code  Family Communication: family was present at bedside, at the time of interview.  The pt provided permission to discuss medical plan with the family. Opportunity was given to ask question and all questions were answered satisfactorily.   Disposition:  Pt is from Home, admitted with weakness and fall, found to have UTI and COVID, still has weakness and high risk for fall, which precludes a safe discharge. Discharge to home VS SNF pending PT/OT eval, when clinically improves. C/o dysphagia pending SLP, esophagogram and GI eval may need EGD   Subjective:  No significant overnight  events, patient denies any shortness of breath, has mild cough with phlegm production.  Patient was complaining of dysphagia and sticking of food in the middle of the sternum and also sometimes choking Patient still feels weak, has not ambulated yet so we will get PT and OT eval   Physical Exam: General:  alert oriented to time, place, and person.  Appear in mild distress, affect appropriate Eyes: PERRLA ENT:  Oral Mucosa Clear, moist  Neck: no JVD,  Cardiovascular: S1 and S2 Present, no Murmur,  Respiratory: good respiratory effort, Bilateral Air entry equal and Decreased, no Crackles, no wheezes Abdomen: Bowel Sound present, Soft and no tenderness,  Skin: no rashes  Extremities: no Pedal edema, no calf tenderness Neurologic: without any new focal findings Gait not checked due to patient safety concerns  Vitals:   10/13/21 2116 10/14/21 0305 10/14/21 0811 10/14/21 1139  BP: 139/82 (!) 149/83 131/72 (!) 143/80  Pulse: 90 93 97 94  Resp: 16 16 18 18   Temp: 99.2 F (37.3 C) 97.6 F (36.4 C) 98.5 F (36.9 C) 98.5 F (36.9 C)  TempSrc: Oral Oral    SpO2: 95% 97% 98% 98%  Weight:      Height:        Intake/Output Summary (Last 24 hours) at 10/14/2021 1421 Last data filed at 10/14/2021 1119 Gross per 24 hour  Intake 1290.7 ml  Output 550 ml  Net 740.7 ml   Filed Weights   10/12/21 1423  Weight: 36.3 kg    Data Reviewed: I have personally reviewed and interpreted daily labs, tele strips, imagings as discussed above. I reviewed all nursing notes, pharmacy notes, vitals, pertinent old records I have discussed plan of care as described above with RN and patient/family.  CBC: Recent Labs  Lab 10/12/21 1424 10/13/21 0739 10/14/21 0447  WBC 7.2 5.4 4.9  HGB 12.1 11.4* 11.6*  HCT 36.8 35.4* 35.6*  MCV 92.2 91.2 90.6  PLT 170 160 168   Basic Metabolic Panel: Recent Labs  Lab 10/12/21 1424 10/13/21 0739 10/14/21 0447  NA 137 139 139  K 3.7 3.5 3.5  CL 104 108 108  CO2 26 22 19*  GLUCOSE 126* 78 79  BUN 14 11 10   CREATININE 0.41* 0.39*  0.37* 0.36*  CALCIUM 8.9 8.0* 8.0*  MG  --  1.8 1.8  PHOS  --  2.2* 2.2*    Studies: No results found.  Scheduled Meds:  vitamin C  500 mg Oral Daily   enoxaparin (LOVENOX) injection  30 mg Subcutaneous Q24H   folic acid  1 mg Oral Daily   iron polysaccharides  150 mg Oral Daily   multivitamin with minerals  1 tablet Oral Daily    phosphorus  500 mg Oral QID   zinc sulfate  220 mg Oral BID   Continuous Infusions:  sodium chloride 50 mL/hr at 10/14/21 1002   cefTRIAXone (ROCEPHIN)  IV Stopped (10/13/21 2147)   remdesivir 100 mg in NS 100 mL 100 mg (10/14/21 1004)   PRN Meds: acetaminophen **OR** acetaminophen, guaiFENesin-dextromethorphan, HYDROcodone bit-homatropine, ondansetron **OR** ondansetron (ZOFRAN) IV  Time spent: 35 minutes  Author: [id number]. MD [facility] 10/14/2021 2:21 PM  To reach On-call, see care teams to locate the attending and reach out to them via [email or web address]. If 7PM-7AM, please contact night-coverage If you still have difficulty reaching the attending provider, please page the [facility] (Director on Call) for [facility] on amion for assistance.

## 2021-10-14 NOTE — Progress Notes (Signed)
OT Cancellation Note  Patient Details Name: Lindsey Burke MRN: [id number] DOB: 09-09-1934   Cancelled Treatment:    Reason Eval/Treat Not Completed: Patient at procedure or test/ unavailable. Order received and chart reviewed. Upon arrival transport preparing to take pt to imaging. Will hold and follow up as available to initiate services.  Kathie Dike, M.S. OTR/L  10/14/21, 2:54 PM  ascom [phone]

## 2021-10-14 NOTE — Progress Notes (Signed)
*  PRELIMINARY RESULTS* Echocardiogram 2D Echocardiogram has been performed.  Lindsey Burke Reason 10/14/2021, 11:40 AM

## 2021-10-14 NOTE — Evaluation (Signed)
Physical Therapy Evaluation Patient Details Name: Lindsey Burke MRN: [id number] DOB: 01/21/1934 Today's Date: 10/14/2021  History of Present Illness  Lindsey Burke is an 87yoF who comes to [facility] on 10/12/21 acute severe onset weakness, down on floor at home prior to calling DTR. In ED pt found to be tachycardic at 107. UA with greater than 160 ketones positive nitrites and many bacteria. Pt is COVID positive. Head CT unrevealing.  Clinical Impression  Pt admitted with above diagnosis. Pt currently with functional limitations due to the deficits listed below (see "PT Problem List"). Patient agreeable to PT evaluation, DTR in room. Patient provides detailed description of PLOF and home environment. ModI bed mobility, transfers and supervision level AMB due to IV pole and first time RW use, but not LOB during gait training. Pt left in chair, then moved back to bed for transport to xray. Palpation of anterior chest reveals no concordant pain with palpation of central sternum, nor with left costosternal joints, however palpation of clavicular pec major is tender with palpation and similar to symptoms. Pt also reports deeper pain more central during coughing. Patient's assessment this date reveals the patient requires an additional person present for safety and/or physical assistance to complete their typical ADL. At baseline, the patient is able to perform ADL with modified independence. Patient will benefit from skilled PT intervention to maximize independence and safety in mobility required for basic ADL performance at discharge.         Recommendations for follow up therapy are one component of a multi-disciplinary discharge planning process, led by the attending physician.  Recommendations may be updated based on patient status, additional functional criteria and insurance authorization.  Follow Up Recommendations Home health PT    Assistance Recommended at Discharge Set up Supervision/Assistance   Functional Status Assessment Patient has had a recent decline in their functional status and demonstrates the ability to make significant improvements in function in a reasonable and predictable amount of time.  Equipment Recommendations  Rolling walker (2 wheels)    Recommendations for Other Services       Precautions / Restrictions Precautions Precautions: Fall Restrictions Weight Bearing Restrictions: No      Mobility  Bed Mobility Overal bed mobility: Modified Independent                  Transfers Overall transfer level: Modified independent Equipment used: Rolling walker (2 wheels);1 person hand held assist Transfers: Sit to/from Stand Sit to Stand: Modified independent (Device/Increase time);Supervision                Ambulation/Gait Ambulation/Gait assistance: Supervision Gait Distance (Feet): 120 Feet (easily could've kept going) Assistive device: Rolling walker (2 wheels) Gait Pattern/deviations: Step-through pattern       General Gait Details: RW education for first time use  Stairs            Wheelchair Mobility    Modified Rankin (Stroke Patients Only)       Balance Overall balance assessment: Mild deficits observed, not formally tested                                           Pertinent Vitals/Pain Pain Assessment: Faces Faces Pain Scale: Hurts even more (chest with coughing or use of arms for pushing/pulling in gross mobility) Pain Intervention(s): Limited activity within patient's tolerance;Monitored during session    Home Living Family/patient  expects to be discharged to:: Private residence Living Arrangements: Alone Available Help at Discharge: Family;Available 24 hours/day (DTR Hope lives nextdoor) Type of Home: House Home Access: Stairs to enter Entrance Stairs-Rails: Lawyer of Steps: 3-4 steps   Home Layout: One level;Laundry or work area in [employer]:  None      Prior Function Prior Level of Function : History of Falls (last six months)             Mobility Comments: Pt drives, gets her meds, makes meals; 1 fall in April; during bilat foot cramping epidoe. ADLs Comments: independent     Hand Dominance        Extremity/Trunk Assessment   Upper Extremity Assessment Upper Extremity Assessment: Overall WFL for tasks assessed;Generalized weakness    Lower Extremity Assessment Lower Extremity Assessment: Generalized weakness;Overall WFL for tasks assessed       Communication      Cognition Arousal/Alertness: Awake/alert Behavior During Therapy: WFL for tasks assessed/performed Overall Cognitive Status: Within Functional Limits for tasks assessed                                          General Comments      Exercises     Assessment/Plan    PT Assessment Patient needs continued PT services  PT Problem List Decreased balance;Decreased activity tolerance;Decreased strength;Decreased mobility;Decreased knowledge of use of DME       PT Treatment Interventions DME instruction;Gait training;Stair training;Balance training;Neuromuscular re-education;Functional mobility training;Patient/family education;Therapeutic activities;Therapeutic exercise    PT Goals (Current goals can be found in the Care Plan section)  Acute Rehab PT Goals Patient Stated Goal: regain strength and independence PT Goal Formulation: With patient Time For Goal Achievement: 10/28/21 Potential to Achieve Goals: Good    Frequency Min 2X/week   Barriers to discharge        Co-evaluation               AM-PAC PT "6 Clicks" Mobility  Outcome Measure Help needed turning from your back to your side while in a flat bed without using bedrails?: None Help needed moving from lying on your back to sitting on the side of a flat bed without using bedrails?: None Help needed moving to and from a bed to a chair (including a  wheelchair)?: A Little Help needed standing up from a chair using your arms (e.g., wheelchair or bedside chair)?: A Little Help needed to walk in hospital room?: A Little Help needed climbing 3-5 steps with a railing? : A Little 6 Click Score: 20    End of Session   Activity Tolerance: Patient tolerated treatment well Patient left: in bed;with nursing/sitter in room;with family/visitor present   PT Visit Diagnosis: Muscle weakness (generalized) (M62.81);Unsteadiness on feet (R26.81);Difficulty in walking, not elsewhere classified (R26.2)    Time: 7425-9563 PT Time Calculation (min) (ACUTE ONLY): 42 min   Charges:   PT Evaluation $PT Eval Low Complexity: 1 Low PT Treatments $Therapeutic Exercise: 23-37 mins       3:04 PM, 10/14/21 Rosamaria Lints, PT, DPT Physical Therapist - [facility]  [phone] (ASCOM)    Anwitha Mapes C 10/14/2021, 3:01 PM

## 2021-10-15 DIAGNOSIS — R1319 Other dysphagia: Secondary | ICD-10-CM

## 2021-10-15 LAB — CBC
HCT: 41.7 % (ref 36.0–46.0)
Hemoglobin: 13.6 g/dL (ref 12.0–15.0)
MCH: 29.8 pg (ref 26.0–34.0)
MCHC: 32.6 g/dL (ref 30.0–36.0)
MCV: 91.2 fL (ref 80.0–100.0)
Platelets: 176 10*3/uL (ref 150–400)
RBC: 4.57 MIL/uL (ref 3.87–5.11)
RDW: 13.5 % (ref 11.5–15.5)
WBC: 5.3 10*3/uL (ref 4.0–10.5)
nRBC: 0 % (ref 0.0–0.2)

## 2021-10-15 LAB — GLUCOSE, CAPILLARY
Glucose-Capillary: 86 mg/dL (ref 70–99)
Glucose-Capillary: 169 mg/dL — ABNORMAL HIGH (ref 70–99)
Glucose-Capillary: 116 mg/dL — ABNORMAL HIGH (ref 70–99)
Glucose-Capillary: 110 mg/dL — ABNORMAL HIGH (ref 70–99)

## 2021-10-15 LAB — BASIC METABOLIC PANEL
Anion gap: 12 (ref 5–15)
BUN: 9 mg/dL (ref 8–23)
CO2: 21 mmol/L — ABNORMAL LOW (ref 22–32)
Calcium: 7.9 mg/dL — ABNORMAL LOW (ref 8.9–10.3)
Chloride: 108 mmol/L (ref 98–111)
Creatinine, Ser: 0.39 mg/dL — ABNORMAL LOW (ref 0.44–1.00)
GFR, Estimated: >60 mL/min (ref >60)
Glucose, Bld: 69 mg/dL — ABNORMAL LOW (ref 70–99)
Potassium: 3.4 mmol/L — ABNORMAL LOW (ref 3.5–5.1)
Sodium: 141 mmol/L (ref 135–145)

## 2021-10-15 LAB — PHOSPHORUS: Phosphorus: 3.3 mg/dL (ref 2.5–4.6)

## 2021-10-15 LAB — URINE CULTURE: Culture: >=100000 — AB

## 2021-10-15 LAB — HEPATIC FUNCTION PANEL
ALT: 21 U/L (ref 0–44)
AST: 40 U/L (ref 15–41)
Albumin: 3.4 g/dL — ABNORMAL LOW (ref 3.5–5.0)
Alkaline Phosphatase: 46 U/L (ref 38–126)
Bilirubin, Direct: <0.1 mg/dL (ref 0.0–0.2)
Total Bilirubin: 0.9 mg/dL (ref 0.3–1.2)
Total Protein: 6 g/dL — ABNORMAL LOW (ref 6.5–8.1)

## 2021-10-15 LAB — C-REACTIVE PROTEIN: CRP: 3.8 mg/dL — ABNORMAL HIGH (ref <1.0)

## 2021-10-15 LAB — MAGNESIUM: Magnesium: 2 mg/dL (ref 1.7–2.4)

## 2021-10-15 LAB — D-DIMER, QUANTITATIVE: D-Dimer, Quant: 1.64 ug/mL-FEU — ABNORMAL HIGH (ref 0.00–0.50)

## 2021-10-15 MED ORDER — DEXTROSE 50 % IV SOLN
12.5000 g | INTRAVENOUS | Status: AC
  Filled 2021-10-15: qty 50

## 2021-10-15 MED ORDER — METOPROLOL TARTRATE 25 MG PO TABS
12.5000 mg | ORAL_TABLET | Freq: Two times a day (BID) | ORAL | Status: DC
  Administered 2021-10-15 – 2021-10-16 (×3): 12.5 mg via ORAL
  Filled 2021-10-15 (×3): qty 1

## 2021-10-15 MED ORDER — POTASSIUM CHLORIDE 20 MEQ PO PACK
40.0000 meq | PACK | Freq: Once | ORAL | Status: AC
Start: 2021-10-15 — End: 2021-10-15
  Administered 2021-10-15: 11:00:00 40 meq via ORAL
  Filled 2021-10-15: qty 2

## 2021-10-15 MED ORDER — SODIUM CHLORIDE 0.9 % IV SOLN: INTRAVENOUS | Status: DC

## 2021-10-15 NOTE — TOC Initial Note (Addendum)
Transition of Care [facility](TOC) - Initial/Assessment Note    Patient Details  Name: Lindsey Burke MRN: [id number] Date of Birth: 03/04/1934  Transition of Care [facility](TOC) CM/SW Contact:    Liliana ClineMeagan E Demitra Danley, LCSW Phone Number: 10/15/2021, 9:55 AM  Clinical Narrative:                Spoke to daughter [facility] regarding PT recs. Patient lives alone but daughter lives next door and patient will be staying at her daughter's home while she gets stronger. Daughter's address is [address] Patient's PCP is Dr. Arlana Pouchate. Pharmacy is [facility] [address] Patient usually drives herself to appointments, but daughter will be providing transportation upon DC. Patient and daughter are agreeable to HHPT and RW recs. No agency preferences. RW referral made to [facility]. HHPT and RN referral made to [facility] CowerJason.   Expected Discharge Plan: Home w Home Health Services Barriers to Discharge: Continued Medical Work up   Patient Goals and CMS Choice Patient states their goals for this hospitalization and ongoing recovery are:: home with home health CMS Medicare.gov Compare Post Acute Care list provided to:: Patient Represenative (must comment) Choice offered to / list presented to : Adult Children  Expected Discharge Plan and Services Expected Discharge Plan: Home w Home Health Services       Living arrangements for the past 2 months: Single Family Home                 DME Arranged: Walker rolling DME Agency: [facility] Date DME Agency Contacted: 10/15/21   Representative spoke with at DME Agency: Leavy CellaJasmine HH Arranged: PT HH Agency: [facility] Date HH Agency Contacted: 10/15/21   [facility]ower[facility]  Prior Living Arrangements/Services Living arrangements for the past 2 months: Single Family Home Lives with:: Self Patient language and need for interpreter reviewed:: Yes Do you feel safe going back to the place where you live?: Yes      Need  for Family Participation in Patient Care: Yes (Comment) Care giver support system in place?: Yes (comment)   Criminal Activity/Legal Involvement Pertinent to Current Situation/Hospitalization: No - Comment as needed  Activities of Daily Living Home Assistive Devices/Equipment: None ADL Screening (condition at time of admission) Patient's cognitive ability adequate to safely complete daily activities?: Yes Is the patient deaf or have difficulty hearing?: No Does the patient have difficulty seeing, even when wearing glasses/contacts?: Yes Does the patient have difficulty concentrating, remembering, or making decisions?: No Patient able to express need for assistance with ADLs?: Yes Does the patient have difficulty dressing or bathing?: No Independently performs ADLs?: Yes (appropriate for developmental age) Does the patient have difficulty walking or climbing stairs?: No Weakness of Legs: Both Weakness of Arms/Hands: Both  Permission Sought/Granted Permission sought to share information with : [employer] Contact Representative Permission granted to share information with : Yes, Verbal Permission Granted     Permission granted to share info w AGENCY: HH, DME agencies        Emotional Assessment       Orientation: : Oriented to Self, Oriented to Place, Oriented to  Time, Oriented to Situation Alcohol / Substance Use: Not Applicable Psych Involvement: No (comment)  Admission diagnosis:  Acute cystitis with hematuria [N30.01] Generalized weakness [R53.1] Chest pain, unspecified type [R07.9] COVID-19 virus infection [U07.1] COVID-19 [U07.1] Patient Active Problem List   Diagnosis Date Noted   Dysphagia 10/14/2021   COVID-19 virus infection 10/13/2021  Rhabdomyolysis 10/13/2021   UTI (urinary tract infection) 10/13/2021   COPD (chronic obstructive pulmonary disease) (HCC)    Generalized weakness    Elevated troponin    Underweight    PCP:  Jaclyn Shaggy, MD Pharmacy:    [facility], [place] - [address] [place] [address] Phone: [phone] Fax: [phone]  [id number] [place], [place] - [address] AT [facility] [address] [place] [address] Phone: [phone] Fax: [phone]     Social Determinants of Health (SDOH) Interventions    Readmission Risk Interventions No flowsheet data found.

## 2021-10-15 NOTE — Evaluation (Addendum)
Clinical/Bedside Swallow Evaluation Patient Details  Name: Lindsey Burke MRN: [id number] Date of Birth: July 26, 1934  Today's Date: 10/15/2021 Time: SLP Start Time (ACUTE ONLY): 0815 SLP Stop Time (ACUTE ONLY): 0915 SLP Time Calculation (min) (ACUTE ONLY): 60 min  Past Medical History:  Past Medical History:  Diagnosis Date   Back pain    Cholelithiasis    COPD (chronic obstructive pulmonary disease) (HCC)    Gallstones    GERD (gastroesophageal reflux disease)    Hammertoe    Hepatitis    Hyperlipidemia    Kidney stones 1970   Macular degeneration    MVP (mitral valve prolapse)    Nephrolithiasis    Ovarian cyst 2015   Ovarian cyst    Past Surgical History:  Past Surgical History:  Procedure Laterality Date   AUGMENTATION MAMMAPLASTY Bilateral    bilateral implants   BREAST SURGERY     implants   colonscopy  2012   Unremarkable-Dr Mechele Collin   ESOPHAGOGASTRODUODENOSCOPY (EGD) WITH PROPOFOL N/A 06/18/2015   Procedure: ESOPHAGOGASTRODUODENOSCOPY (EGD) WITH PROPOFOL;  Surgeon: Scot Jun, MD;  Location: [facility] ENDOSCOPY;  Service: Endoscopy;  Laterality: N/A;   SAVORY DILATION N/A 06/18/2015   Procedure: SAVORY DILATION;  Surgeon: Scot Jun, MD;  Location: [facility] ENDOSCOPY;  Service: Endoscopy;  Laterality: N/A;   HPI:  Pt is a 87 y.o. female with medical history significant for COPD and who lives independently who was in her usual state of health until 2 days prior when she reports that around 12 AM that night she accidentally took an extra dose of her 0.5 mg Ativan.  She states she usually takes this for her anxiety and sleep, but she felt extremely weak last night after taking the extra dose accidentally.  She states she had to lay down on the ground in her home and was unable to get up until about 4 AM.  At that time, she was able to call family and EMS was contacted. On the day of arrival she continued to be very weak to where she could not get out of bed.  They  eventually decided to come into the ED.  She admits to recently having a cough but denies shortness of breath, fever, nausea, vomiting, abdominal pain or diarrhea.  She denies chest pain.  COVID+ on Isolation this admit.   Imaging: CT head nonacute, chest x-ray with no edema infiltrate or effusion.  During this admit, pt c/o food getting "stuck" -- ongoing for a "few months"; DG Esophagus completed on 10/14/2021: "Question of a mucosal exophytic lesion at the level of the  hypopharynx displacing contrast.  Persistent narrowing at the distal esophagus with contrast retention  likely reflects stricture. Presence of mass is also not excluded.".  Pt stated she had and EGD several years ago requring Esophageal Dilation.    Assessment / Plan / Recommendation  Clinical Impression  Pt appears to present w/ grossly adequate oropharyngeal phase swallow function w/ No immediate presentation of oropharyngeal phase dysphagia noted, No neuromuscular deficits noted. Pt consumed po trials w/ No immediate, overt clinical s/s of aspiration during po trials. However, pt does have presentation of delayed, mild throat clearing w/ po intake. This has been ongoing at home along w/ c/o Esophageal Dysmotility and foods "getting stuck" w/ Regurgitation feelings for "several months". DG Esophagus Imaging on 10/14/2021 revealed: "Question of a mucosal exophytic lesion at the level of the hypopharynx displacing contrast.  Persistent narrowing at the distal esophagus with contrast retention  likely reflects  stricture. Presence of mass is also not excluded.".  Pt stated she had and EGD several years ago requring Esophageal Dilation.  Pt appears at reduced risk for aspiration from an oropharyngeal phase standpoint following general aspiration and Reflux precautions. Though, she is at risk for potential aspiration of po material from Esophageal Dysmotility impacting the pharyngeal phase.     During po trials, pt consumed consistencies w/ no  immediate, overt coughing, decline in vocal quality, or change in respiratory presentation during/post trials. Oral phase appeared [facility] w/ timely bolus management and control of bolus propulsion for A-P transfer for swallowing. Oral clearing achieved w/ all trial consistencies. No solids assessed d/t diet restriction and Esophageal Dysmotility. OM Exam appeared [facility] w/ no unilateral weakness noted. Speech Clear. Pt fed self w/ setup support.   Recommend continue the Full liquids diet as ordered w/ POC including an EGD tomorrow per NSG/chart. Thin liquids VIA CUP - pt does not use straws at home. Recommend general aspiration and Reflux precautions, Pills CRUSHED in Puree for safer, easier swallowing as pt described Larger pills causing difficulty to swallow; Esophageal dysmotility as well. Education given on Pills in Puree; food consistencies and easy to eat options; general aspiration precautions. NSG agreed; family and pt agreed. ST services will continue to monitor pt's status while admitted for further needs. SLP Visit Diagnosis: Dysphagia, pharyngoesophageal phase (R13.14) (baseline; EGD planned)    Aspiration Risk  Mild aspiration risk;Risk for inadequate nutrition/hydration    Diet Recommendation   Full liquids diet as ordered w/ POC including an EGD tomorrow per NSG/chart. Thin liquids VIA CUP - pt does not use straws at home. Recommend general aspiration and Reflux precautions.  Medication Administration: Crushed with puree (or in liquid form)    Other  Recommendations Recommended Consults: Consider GI evaluation;Consider esophageal assessment (ongoing) Oral Care Recommendations: Oral care BID;Oral care before and after PO;Patient independent with oral care Other Recommendations:  (n/a)    Recommendations for follow up therapy are one component of a multi-disciplinary discharge planning process, led by the attending physician.  Recommendations may be updated based on patient status, additional  functional criteria and insurance authorization.  Follow up Recommendations  (TBD)      Assistance Recommended at Discharge  (TBD)  Functional Status Assessment Patient has had a recent decline in their functional status and demonstrates the ability to make significant improvements in function in a reasonable and predictable amount of time.  Frequency and Duration min 1 x/week  1 week       Prognosis Prognosis for Safe Diet Advancement: Fair (-Good) Barriers to Reach Goals: Time post onset;Severity of deficits (Esophageal phase deficits)      Swallow Study   General Date of Onset: 10/12/21 HPI: Pt is a 87 y.o. female with medical history significant for COPD and who lives independently who was in her usual state of health until 2 days prior when she reports that around 12 AM that night she accidentally took an extra dose of her 0.5 mg Ativan.  She states she usually takes this for her anxiety and sleep, but she felt extremely weak last night after taking the extra dose accidentally.  She states she had to lay down on the ground in her home and was unable to get up until about 4 AM.  At that time, she was able to call family and EMS was contacted. On the day of arrival she continued to be very weak to where she could not get out of bed.  They eventually decided to come into the ED.  She admits to recently having a cough but denies shortness of breath, fever, nausea, vomiting, abdominal pain or diarrhea.  She denies chest pain.   Imaging: CT head nonacute, chest x-ray with no edema infiltrate or effusion.  During this admit, pt c/o food getting "stuck" -- ongoing for a "few months"; DG Esophagus completed on 10/14/2021: "Question of a mucosal exophytic lesion at the level of the  hypopharynx displacing contrast.  Persistent narrowing at the distal esophagus with contrast retention  likely reflects stricture. Presence of mass is also not excluded.".  Pt stated she had and EGD several years ago  requring Esophageal Dilation. Type of Study: Bedside Swallow Evaluation Previous Swallow Assessment: none Diet Prior to this Study: Thin liquids (full liquids) Temperature Spikes Noted: No (wbc 5.3) Respiratory Status: Room air History of Recent Intubation: No Behavior/Cognition: Alert;Cooperative;Pleasant mood Oral Cavity Assessment: Within Functional Limits Oral Care Completed by SLP: Recent completion by staff Oral Cavity - Dentition: Adequate natural dentition Vision: Functional for self-feeding Self-Feeding Abilities: Able to feed self Patient Positioning: Upright in bed (then to chair for meal later) Baseline Vocal Quality: Normal Volitional Cough: Strong Volitional Swallow: Able to elicit    Oral/Motor/Sensory Function Overall Oral Motor/Sensory Function: Within functional limits   Ice Chips Ice chips: Within functional limits Presentation: Spoon (fed; 2 trials)   Thin Liquid Thin Liquid: Within functional limits Presentation: Cup;Self Fed (6-7 trials) Other Comments: later upon visiting room, pt exhibited mild, inconsistent throat clearing w/ thin liquids.    Nectar Thick Nectar Thick Liquid: Not tested   Honey Thick Honey Thick Liquid: Not tested   Puree Puree: Within functional limits Presentation: Self Fed;Spoon (8 trials) Other Comments: mild throat clear post swallowing - delayed x1   Solid     Solid: Not tested Other Comments: per MD - f/u w/ EGD tomorrow        Jerilynn Som, MS, CCC-SLP Speech Language Pathologist Rehab Services [phone] Henok Heacock 10/15/2021,3:28 PM

## 2021-10-15 NOTE — Consult Note (Addendum)
Lindsey Darby, MD [address]  [place]  [place], [place] [address]  Main: [phone]  Fax: [phone] Pager: [phone]   Consultation  Referring Provider:     No ref. provider found Primary Care Physician:  Albina Billet, MD Primary Gastroenterologist: Althia Forts         Reason for Consultation:     Dysphagia  Date of Admission:  10/12/2021 Date of Consultation:  10/15/2021         HPI:   Lindsey Burke is a 87 y.o. female with long history of difficulty swallowing, dilation in 2016 is admitted to [facility] on 12/7 secondary to accidental overdose of Ativan which resulted in decreased responsiveness, lethargy.  She was also tested positive for COVID, treated with remdesivir.  When I saw the patient in the room, daughter was bedside and patient is sitting up in the chair and having breakfast which was full liquids.  Patient was not on oxygen and she was not in respiratory distress.  Patient has trouble swallowing solid food, underwent barium esophagogram yesterday which revealed persistent narrowing at the distal esophagus with contrast retention likely stricture.  Therefore, GI is consulted to evaluate for EGD   NSAIDs: None  Antiplts/Anticoagulants/Anti thrombotics: None  GI Procedures:  EGD 06/18/2015 - Normal esophagus. Dilated to 18 mm. - Normal stomach. - Normal examined duodenum. - No specimens collected.  Past Medical History:  Diagnosis Date  . Back pain   . Cholelithiasis   . COPD (chronic obstructive pulmonary disease) ([place])   . Gallstones   . GERD (gastroesophageal reflux disease)   . Hammertoe   . Hepatitis   . Hyperlipidemia   . Kidney stones 1970  . Macular degeneration   . MVP (mitral valve prolapse)   . Nephrolithiasis   . Ovarian cyst 2015  . Ovarian cyst     Past Surgical History:  Procedure Laterality Date  . AUGMENTATION MAMMAPLASTY Bilateral    bilateral implants  . BREAST SURGERY     implants  . colonscopy  2012    Unremarkable-Dr Vira Agar  . ESOPHAGOGASTRODUODENOSCOPY (EGD) WITH PROPOFOL N/A 06/18/2015   Procedure: ESOPHAGOGASTRODUODENOSCOPY (EGD) WITH PROPOFOL;  Surgeon: Manya Silvas, MD;  Location: [facility] ENDOSCOPY;  Service: Endoscopy;  Laterality: N/A;  . SAVORY DILATION N/A 06/18/2015   Procedure: SAVORY DILATION;  Surgeon: Manya Silvas, MD;  Location: [facility] ENDOSCOPY;  Service: Endoscopy;  Laterality: N/A;    Prior to Admission medications   Medication Sig Start Date End Date Taking? Authorizing Provider  alendronate (FOSAMAX) 70 MG tablet Take 1 tablet by mouth once a week. 04/07/15  Yes [provider]  aspirin EC 81 MG tablet Take 81 mg by mouth daily.   Yes [provider]  Calcium Carbonate (OS-CAL PO) Take 1 tablet by mouth daily.   Yes [provider]  cephALEXin (KEFLEX) 500 MG capsule Take 1 capsule (500 mg total) by mouth 3 (three) times daily for 7 days. 10/13/21 10/20/21 Yes Verones[place], [place], MD  gabapentin (NEURONTIN) 300 MG capsule Take 1 capsule by mouth at bedtime. 02/23/15  Yes [provider]  LORazepam (ATIVAN) 0.5 MG tablet Take 1 tablet by mouth every 8 (eight) hours as needed. 03/23/15  Yes [provider]  Multiple Vitamins-Minerals (PRESERVISION AREDS PO) Take 1 tablet by mouth daily.   Yes [provider]  Omega-3 Fatty Acids (FISH OIL) 1000 MG CAPS Take 1 capsule by mouth daily.   Yes [provider]  omeprazole ([place])  20 MG capsule Take 20 mg by mouth daily.   Yes [provider]    Current Facility-Administered Medications:  .  acetaminophen (TYLENOL) tablet 650 mg, 650 mg, Oral, Q6H PRN, 650 mg at 10/13/21 1729 **OR** acetaminophen (TYLENOL) suppository 650 mg, 650 mg, Rectal, Q6H PRN, Athena Masse, MD .  ascorbic acid (VITAMIN C) tablet 500 mg, 500 mg, Oral, Daily, Val Riles, MD, 500 mg at 10/15/21 1039 .  cefTRIAXone (ROCEPHIN) 1 g in sodium chloride 0.9 % 100 mL IVPB, 1 g, Intravenous,  Q24H, Athena Masse, MD, Stopped at 10/14/21 2342 .  dextrose 50 % solution 12.5 g, 12.5 g, Intravenous, STAT, Kumar, Dileep, MD .  enoxaparin (LOVENOX) injection 30 mg, 30 mg, Subcutaneous, Q24H, Judd Gaudier V, MD, 30 mg at 10/15/21 1051 .  folic acid (FOLVITE) tablet 1 mg, 1 mg, Oral, Daily, Judd Gaudier V, MD, 1 mg at 10/15/21 1040 .  guaiFENesin-dextromethorphan (ROBITUSSIN DM) 100-10 MG/5ML syrup 10 mL, 10 mL, Oral, Q4H PRN, Judd Gaudier V, MD .  HYDROcodone bit-homatropine (HYCODAN) 5-1.5 MG/5ML syrup 5 mL, 5 mL, Oral, Q6H PRN, Val Riles, MD .  iron polysaccharides (NIFEREX) capsule 150 mg, 150 mg, Oral, Daily, Val Riles, MD, 150 mg at 10/15/21 1039 .  metoprolol tartrate (LOPRESSOR) tablet 12.5 mg, 12.5 mg, Oral, BID, Val Riles, MD, 12.5 mg at 10/15/21 1039 .  multivitamin with minerals tablet 1 tablet, 1 tablet, Oral, Daily, Athena Masse, MD, 1 tablet at 10/15/21 1040 .  ondansetron (ZOFRAN) tablet 4 mg, 4 mg, Oral, Q6H PRN **OR** ondansetron (ZOFRAN) injection 4 mg, 4 mg, Intravenous, Q6H PRN, Judd Gaudier V, MD .  pantoprazole (PROTONIX) injection 40 mg, 40 mg, Intravenous, Q12H, Darell Saputo, Tally Due, MD, 40 mg at 10/15/21 1051 .  phosphorus (K PHOS NEUTRAL) tablet 500 mg, 500 mg, Oral, QID, Val Riles, MD, 500 mg at 10/15/21 1040 .  zinc sulfate capsule 220 mg, 220 mg, Oral, BID, Val Riles, MD, 220 mg at 10/15/21 1040  Family History  Problem Relation Age of Onset  . Cancer Mother   . Cholelithiasis Father   . Tuberculosis Father   . Cancer Father        Prostate and Larynx  . Cholelithiasis Sister   . Breast cancer Maternal Aunt      Social History   Tobacco Use  . Smoking status: Never  . Smokeless tobacco: Never  Substance Use Topics  . Alcohol use: Yes    Alcohol/week: 1.0 standard drink    Types: 1 Glasses of wine per week    Comment: occasional  . Drug use: No    Allergies as of 10/12/2021 - Review Complete 10/12/2021  Allergen  Reaction Noted  . Augmentin [amoxicillin-pot clavulanate] Other (See Comments) 04/15/2015    Review of Systems:    All systems reviewed and negative except where noted in HPI.   Physical Exam:  Vital signs in last 24 hours: Temp:  [98.1 F (36.7 C)-98.9 F (37.2 C)] 98.6 F (37 C) (12/10 0811) Pulse Rate:  [94-102] 99 (12/10 0951) Resp:  [16-18] 18 (12/10 0811) BP: (135-161)/(70-89) 135/70 (12/10 0951) SpO2:  [97 %-100 %] 100 % (12/10 0951) Last BM Date: 10/14/21 General:   Pleasant, cooperative in NAD, thin built Head:  Normocephalic and atraumatic. Eyes:   No icterus.   Conjunctiva pink. PERRLA. Ears:  Normal auditory acuity. Neck:  Supple; no masses or thyroidomegaly Lungs: Respirations even and unlabored. Lungs clear to auscultation bilaterally.   No wheezes,  crackles, or rhonchi.  Heart:  Regular rate and rhythm;  Without murmur, clicks, rubs or gallops Abdomen:  Soft, nondistended, nontender. Normal bowel sounds. No appreciable masses or hepatomegaly.  No rebound or guarding.  Rectal:  Not performed. Msk:  Symmetrical without gross deformities.  Extremities:  Without edema, cyanosis or clubbing. Neurologic:  Alert and oriented x3;  grossly normal neurologically. Skin:  Intact without significant lesions or rashes. Cervical Nodes:  No significant cervical adenopathy. Psych:  Alert and cooperative. Normal affect.  LAB RESULTS: CBC Latest Ref Rng & Units 10/15/2021 10/14/2021 10/13/2021  WBC 4.0 - 10.5 K/uL 5.3 4.9 5.4  Hemoglobin 12.0 - 15.0 g/dL 13.6 11.6(L) 11.4(L)  Hematocrit 36.0 - 46.0 % 41.7 35.6(L) 35.4(L)  Platelets 150 - 400 K/uL 176 168 160    BMET BMP Latest Ref Rng & Units 10/15/2021 10/14/2021 10/13/2021  Glucose 70 - 99 mg/dL 69(L) 79 78  BUN 8 - 23 mg/dL '9 10 11  ' Creatinine 0.44 - 1.00 mg/dL 0.39(L) 0.36(L) 0.39(L)  Sodium 135 - 145 mmol/L 141 139 139  Potassium 3.5 - 5.1 mmol/L 3.4(L) 3.5 3.5  Chloride 98 - 111 mmol/L 108 108 108  CO2 22 - 32 mmol/L  21(L) 19(L) 22  Calcium 8.9 - 10.3 mg/dL 7.9(L) 8.0(L) 8.0(L)    LFT Hepatic Function Latest Ref Rng & Units 10/15/2021 10/14/2021 10/12/2021  Total Protein 6.5 - 8.1 g/dL 6.0(L) 5.6(L) 6.8  Albumin 3.5 - 5.0 g/dL 3.4(L) 3.1(L) 4.0  AST 15 - 41 U/L 40 32 34  ALT 0 - 44 U/L '21 20 20  ' Alk Phosphatase 38 - 126 U/L 46 40 45  Total Bilirubin 0.3 - 1.2 mg/dL 0.9 0.7 0.8  Bilirubin, Direct 0.0 - 0.2 mg/dL <0.1 <0.1 -     STUDIES: ECHOCARDIOGRAM COMPLETE  Result Date: 10/14/2021    ECHOCARDIOGRAM REPORT   Patient Name:   Lindsey Burke Date of Exam: 10/14/2021 Medical Rec #:  [id number]           Height:       60.0 in Accession #:    [id number]          Weight:       80.0 lb Date of Birth:  07/06/1934            BSA:          1.265 m Patient Age:    80 years            BP:           149/83 mmHg Patient Gender: F                   HR:           96 bpm. Exam Location:  [facility] Procedure: 2D Echo, Color Doppler and Cardiac Doppler Indications:     R07.9 Chest Pain  History:         Patient has no prior history of Echocardiogram examinations.                  COPD; Risk Factors:Dyslipidemia. Pt tested positive for                  COVID-19.  Sonographer:     Charmayne Sheer Referring Phys:  [id number] Val Riles Diagnosing Phys: Ida Rogue MD  Sonographer Comments: Suboptimal parasternal window. Image acquisition challenging due to breast implants and Image acquisition challenging due to COPD. IMPRESSIONS  1. Left ventricular ejection fraction, by estimation, is  60 to 65%. The left ventricle has normal function. The left ventricle has no regional wall motion abnormalities. There is mild left ventricular hypertrophy. Left ventricular diastolic parameters are consistent with Grade I diastolic dysfunction (impaired relaxation). The average left ventricular global longitudinal strain is -13.8 %. The global longitudinal strain is abnormal.  2. Right ventricular systolic function is normal. The right ventricular size is  normal. Tricuspid regurgitation signal is inadequate for assessing PA pressure.  3. The mitral valve is normal in structure. Mild mitral valve regurgitation. No evidence of mitral stenosis.  4. The aortic valve is normal in structure. Aortic valve regurgitation is not visualized. No aortic stenosis is present.  5. The inferior vena cava is normal in size with greater than 50% respiratory variability, suggesting right atrial pressure of 3 mmHg. FINDINGS  Left Ventricle: Left ventricular ejection fraction, by estimation, is 60 to 65%. The left ventricle has normal function. The left ventricle has no regional wall motion abnormalities. The average left ventricular global longitudinal strain is -13.8 %. The global longitudinal strain is abnormal. The left ventricular internal cavity size was normal in size. There is mild left ventricular hypertrophy. Left ventricular diastolic parameters are consistent with Grade I diastolic dysfunction (impaired relaxation). Right Ventricle: The right ventricular size is normal. No increase in right ventricular wall thickness. Right ventricular systolic function is normal. Tricuspid regurgitation signal is inadequate for assessing PA pressure. Left Atrium: Left atrial size was normal in size. Right Atrium: Right atrial size was normal in size. Pericardium: There is no evidence of pericardial effusion. Mitral Valve: The mitral valve is normal in structure. There is mild calcification of the mitral valve leaflet(s). Mild mitral valve regurgitation. No evidence of mitral valve stenosis. MV peak gradient, 3.1 mmHg. The mean mitral valve gradient is 2.0 mmHg. Tricuspid Valve: The tricuspid valve is normal in structure. Tricuspid valve regurgitation is not demonstrated. No evidence of tricuspid stenosis. Aortic Valve: The aortic valve is normal in structure. Aortic valve regurgitation is not visualized. No aortic stenosis is present. Aortic valve mean gradient measures 4.0 mmHg. Aortic valve  peak gradient measures 7.1 mmHg. Aortic valve area, by VTI measures 2.64 cm. Pulmonic Valve: The pulmonic valve was normal in structure. Pulmonic valve regurgitation is not visualized. No evidence of pulmonic stenosis. Aorta: The aortic root is normal in size and structure. Venous: The inferior vena cava is normal in size with greater than 50% respiratory variability, suggesting right atrial pressure of 3 mmHg. IAS/Shunts: No atrial level shunt detected by color flow Doppler.  LEFT VENTRICLE PLAX 2D LVIDd:         3.70 cm     Diastology LVIDs:         2.20 cm     LV e' medial:    5.00 cm/s LV PW:         0.60 cm     LV E/e' medial:  15.0 LV IVS:        0.80 cm     LV e' lateral:   9.25 cm/s LVOT diam:     2.00 cm     LV E/e' lateral: 8.1 LV SV:         64 LV SV Index:   51          2D Longitudinal Strain LVOT Area:     3.14 cm    2D Strain GLS Avg:     -13.8 %  LV Volumes (MOD) LV vol d, MOD A2C: 33.7 ml LV vol  d, MOD A4C: 47.7 ml LV vol s, MOD A2C: 15.5 ml LV vol s, MOD A4C: 25.1 ml LV SV MOD A2C:     18.2 ml LV SV MOD A4C:     47.7 ml LV SV MOD BP:      20.5 ml RIGHT VENTRICLE RV Basal diam:  3.10 cm RV S prime:     10.20 cm/s LEFT ATRIUM             Index        RIGHT ATRIUM           Index LA diam:        2.10 cm 1.66 cm/m   RA Area:     12.90 cm LA Vol (A2C):   35.6 ml 28.15 ml/m  RA Volume:   32.00 ml  25.31 ml/m LA Vol (A4C):   45.9 ml 36.30 ml/m LA Biplane Vol: 40.8 ml 32.26 ml/m  AORTIC VALVE                    PULMONIC VALVE AV Area (Vmax):    2.83 cm     PV Vmax:       0.95 m/s AV Area (Vmean):   2.80 cm     PV Vmean:      64.400 cm/s AV Area (VTI):     2.64 cm     PV VTI:        0.179 m AV Vmax:           133.00 cm/s  PV Peak grad:  3.6 mmHg AV Vmean:          93.000 cm/s  PV Mean grad:  2.0 mmHg AV VTI:            0.244 m AV Peak Grad:      7.1 mmHg AV Mean Grad:      4.0 mmHg LVOT Vmax:         120.00 cm/s LVOT Vmean:        83.000 cm/s LVOT VTI:          0.205 m LVOT/AV VTI ratio: 0.84   AORTA Ao Root diam: 2.50 cm MITRAL VALVE MV Area (PHT): 4.83 cm    SHUNTS MV Area VTI:   3.58 cm    Systemic VTI:  0.20 m MV Peak grad:  3.1 mmHg    Systemic Diam: 2.00 cm MV Mean grad:  2.0 mmHg MV Vmax:       0.88 m/s MV Vmean:      61.3 cm/s MV Decel Time: 157 msec MV E velocity: 75.00 cm/s MV A velocity: 87.80 cm/s MV E/A ratio:  0.85 Ida Rogue MD Electronically signed by Ida Rogue MD Signature Date/Time: 10/14/2021/6:29:01 PM    Final    DG ESOPHAGUS W SINGLE CM (SOL OR THIN BA)  Result Date: 10/14/2021 CLINICAL DATA:  Dysphagia EXAM: ESOPHOGRAM/BARIUM SWALLOW TECHNIQUE: Single contrast examination was performed using thin barium. FLUOROSCOPY TIME:  Fluoroscopy Time:  1 minutes 18 seconds Radiation Exposure Index (if provided by the fluoroscopic device): 3 mGy COMPARISON:  None. FINDINGS: Technically limited by restrictions on patient positioning and volume of contrast tolerated. Question of a mucosal exophytic lesion at the level of the hypopharynx displacing contrast. There is persistent narrowing at the distal esophagus with retention of contrast. This partially cleared when the patient was put in the left recumbent position. IMPRESSION: Technically limited. Question of a mucosal exophytic lesion at the level of the hypopharynx displacing contrast.  Persistent narrowing at the distal esophagus with contrast retention likely reflects stricture. Presence of mass is also not excluded. Consider cross-sectional imaging to evaluate above. Electronically Signed   By: Macy Mis M.D.   On: 10/14/2021 15:41      Impression / Plan:   Lindsey Burke is a 87 y.o. pleasant Caucasian female with history of COPD, chronic dysphagia secondary to esophageal stricture is admitted secondary to accidental overdose of benzodiazepine, diagnosed with COVID s/p remdesivir, consulted for worsening dysphagia  Dysphagia Continue full liquid diet as tolerated Discussed with patient and her daughter  regarding upper endoscopy with possible dilation tomorrow and they are agreeable Continue Protonix 40 mg IV twice daily and continue long-term N.p.o. effective 5 AM tomorrow  I have discussed alternative options, risks & benefits,  which include, but are not limited to, bleeding, infection, perforation,respiratory complication & drug reaction.  The patient agrees with this plan & written consent will be obtained.     Thank you for involving me in the care of this patient.      LOS: 2 days   Sherri Sear, MD  10/15/2021, 11:10 AM    Note: This dictation was prepared with Dragon dictation along with smaller phrase technology. Any transcriptional errors that result from this process are unintentional.

## 2021-10-15 NOTE — Evaluation (Signed)
Occupational Therapy Evaluation Patient Details Name: Lindsey Burke MRN: [id number] DOB: 08/16/1934 Today's Date: 10/15/2021   History of Present Illness Lindsey Burke is an 87yoF who comes to [facility] on 10/12/21 acute severe onset weakness, down on floor at home prior to calling DTR. In ED pt found to be tachycardic at 107. UA with greater than 160 ketones positive nitrites and many bacteria. Pt is COVID positive. Head CT unrevealing.   Clinical Impression   Pt seen for OT evaluation this date in setting of acute hospitalization d/t COVID. Pt reports being INDEP at baseline and living alone although her daughter is next door. Pt presents this date with mild balance and strength deficits. She currently requires: SETUP for seated UB ADLs, CGA for seated LB ADLs, CGA with fxl mobility, MIN A for one step with RW. Able to perform fxl mobility on flat surface w/ no AD with close SBA. OT will continue to follow acutely to improve strength and balance for safety with ADLs. Anticipate pt will benefit from [facility] f/u.      Recommendations for follow up therapy are one component of a multi-disciplinary discharge planning process, led by the attending physician.  Recommendations may be updated based on patient status, additional functional criteria and insurance authorization.   Follow Up Recommendations  Home health OT    Assistance Recommended at Discharge Set up Supervision/Assistance  Functional Status Assessment  Patient has had a recent decline in their functional status and demonstrates the ability to make significant improvements in function in a reasonable and predictable amount of time.  Equipment Recommendations  Tub/shower seat    Recommendations for Other Services       Precautions / Restrictions Precautions Precautions: Fall Restrictions Weight Bearing Restrictions: No      Mobility Bed Mobility Overal bed mobility: Modified Independent                   Transfers Overall transfer level: Modified independent Equipment used: Rolling walker (2 wheels);1 person hand held assist Transfers: Sit to/from Stand Sit to Stand: Supervision;Modified independent (Device/Increase time)           General transfer comment: demos good control, able to perform w/ and w/out walker. She is slightly unstable initially, but progresses throughout session      Balance Overall balance assessment: Mild deficits observed, not formally tested                                         ADL either performed or assessed with clinical judgement   ADL                                         General ADL Comments: SETUP for seated UB ADLs, CGA for seated LB ADLs, CGA with fxl mobility, MIN A for one step with RW. Able to perform fxl mobility on flat surface w/ no AD with close SBA.     Vision Patient Visual Report: No change from baseline       Perception     Praxis      Pertinent Vitals/Pain Pain Assessment: Faces Faces Pain Scale: Hurts little more Pain Location: chest d/t coughing Pain Descriptors / Indicators: Tender Pain Intervention(s): Limited activity within patient's tolerance;Monitored during session     Hand Dominance  Extremity/Trunk Assessment Upper Extremity Assessment Upper Extremity Assessment: Overall WFL for tasks assessed;Generalized weakness   Lower Extremity Assessment Lower Extremity Assessment: Overall WFL for tasks assessed;Generalized weakness       Communication     Cognition Arousal/Alertness: Awake/alert Behavior During Therapy: WFL for tasks assessed/performed Overall Cognitive Status: Within Functional Limits for tasks assessed                                       General Comments       Exercises Other Exercises Other Exercises: OT ed re: role of OT in acute setting, scap retraction with postural extension exercise for improved surface area for  breathing.   Shoulder Instructions      Home Living Family/patient expects to be discharged to:: Private residence Living Arrangements: Alone Available Help at Discharge: Family;Available 24 hours/day (Dtr Hope lives next door) Type of Home: House Home Access: Stairs to enter [employer] of Steps: 3-4 steps Entrance Stairs-Rails: Left;Right Home Layout: One level;Laundry or work area in [employer]: None          Prior Functioning/Environment Prior Level of Function : History of Falls (last six months)             Mobility Comments: Pt drives, gets her meds, makes meals; 1 fall in April; during bilat foot cramping epidoe. ADLs Comments: independent        OT Problem List: Decreased strength;Decreased activity tolerance      OT Treatment/Interventions: Self-care/ADL training;Therapeutic exercise;DME and/or AE instruction;Therapeutic activities;Patient/family education;Balance training    OT Goals(Current goals can be found in the care plan section) Acute Rehab OT Goals Patient Stated Goal: to go home OT Goal Formulation: With patient Time For Goal Achievement: 10/29/21 Potential to Achieve Goals: Good  OT Frequency: Min 2X/week   Barriers to D/C:            Co-evaluation              AM-PAC OT "6 Clicks" Daily Activity     Outcome Measure Help from another person eating meals?: None Help from another person taking care of personal grooming?: A Little Help from another person toileting, which includes using toliet, bedpan, or urinal?: A Little Help from another person bathing (including washing, rinsing, drying)?: A Little Help from another person to put on and taking off regular upper body clothing?: None Help from another person to put on and taking off regular lower body clothing?: A Little 6 Click Score: 20   End of Session Equipment Utilized During Treatment: Gait belt;Rolling walker (2 wheels) Nurse  Communication: Mobility status;Other (comment) (notified pt performed fxl mobility well to the restroom, bed alarm left off and dtr trained on safety with mobilizing pt and comfortable.)  Activity Tolerance: Patient tolerated treatment well Patient left: with call bell/phone within reach;with family/visitor present (seated EOB, dtr reports she will be staying with patient.)  OT Visit Diagnosis: Unsteadiness on feet (R26.81);Muscle weakness (generalized) (M62.81)                Time: 3846-6599 OT Time Calculation (min): 47 min Charges:  OT General Charges $OT Visit: 1 Visit OT Evaluation $OT Eval Moderate Complexity: 1 Mod OT Treatments $Self Care/Home Management : 8-22 mins $Therapeutic Activity: 8-22 mins  Rejeana Brock, MS, OTR/L ascom [phone] 10/15/21, 4:41 PM

## 2021-10-15 NOTE — Progress Notes (Signed)
[facility] Progress Note  Patient: Lindsey Burke    DJT:[id number]  DOA: 10/12/2021     Date of Service: the patient was seen and examined on 10/15/2021  Chief Complaint  Patient presents with   Weakness   Brief hospital course: Lindsey Burke is a 87 y.o. female with medical history significant for COPD and who lives independently who was in her usual state of health until 2 days prior when she was noted to be very weak to where she is herself onto the floor and could not get up for several hours.  She was able to eventually called her daughter who called rescue and they were able to help her up.  On the day of arrival she continued to be very weak to where she could not get out of bed.  They eventually decided to come into the ED.  She admits to recently having a cough but denies shortness of breath, fever, nausea, vomiting, abdominal pain or diarrhea.  She denies chest pain.   ED course: Tachycardic at 107 with a low-grade temp of 99 with otherwise normal vitals Blood work significant for normal WBC and hemoglobin Troponin 20-24 Total CK 248 CBC and CMP mostly unremarkable UA with greater than 160 ketones positive nitrites and many bacteria COVID-positive   EKG, personally viewed and interpreted: Sinus tachycardia at 104 with no acute ST-T wave changes   Imaging: CT head nonacute, chest x-ray with no edema infiltrate or effusion   Patient treated with an LR bolus and given Rocephin.  Hospitalist consulted for admission     Assessment and Plan:  Distal esophageal stricture, presented with dysphagia and chest pain Continue aspiration precautions SLP eval pending Esophagogram, ?? mucosal exophytic lesion at the level of the hypopharynx displacing contrast. Persistent narrowing at the distal esophagus with contrast retention likely reflects stricture. Presence of mass is also not excluded. GI consulted, plan for EGD tomorrow a.m., keep n.p.o. after  midnight Continue full liquid diet Patient is at risk of hypoglycemia, fingerstick glucose will be monitored AC at bedtime and hypoglycemia protocol will be followed up    E. coli UTI (urinary tract infection) - IV Rocephin, transition to oral antibiotics on discharge - cultures growing E. coli, pan sensitivity  COVID-19 virus infection - Chest x-ray clear and O2 sat normal on room air - Supportive care - Suspecting incidental Patient was started on remdesivir x3 doses, added vitamin C and zinc supplement    Elevated troponin, most likely demand ischemia secondary to COVID infection - Troponin 20-24 and likely related to demand ischemia - Patient denies chest pain and EKG is nonacute TTE shows LVEF 60% grade 1 diastolic dysfunction, no LV wall motion abnormality.   Generalized weakness -Secondary to acute illness and poor oral intake Continue fall precautions - IV hydration - Nutritionist and PT eval CK 248 slightly elevated, we will continue to monitor,    COPD (chronic obstructive pulmonary disease) (HCC) - Not acutely exacerbated - DuoNeb as needed    Underweight - Nutritionist evaluation  Iron deficiency, started oral iron supplement with vitamin C Folic acid within normal range,  vitamin B12 and vitamin D levels are pending  Hypophosphatemia most likely due to poor nutrition Phosphorus repleted.  Body mass index is 15.62 kg/m.   Interventions:   Follow PT and OT eval  Diet: Heart healthy DVT Prophylaxis: Subcutaneous Lovenox   Advance goals of care discussion: Full code  Family Communication: family was present at bedside, at the time of  interview.  The pt provided permission to discuss medical plan with the family. Opportunity was given to ask question and all questions were answered satisfactorily.   Disposition:  Pt is from Home, admitted with weakness and fall, found to have UTI and COVID, still has weakness and high risk for fall, which precludes  a safe discharge. Discharge to home VS SNF pending PT/OT eval, when clinically improves. C/o dysphagia due to distal esophageal stricture, need evaluation by GI, EGD planned for tomorrow    Subjective:  No significant overnight events, patient has decreased appetite and still has dysphagia, chest pain is under control but it is worse when she is trying to cough for eat.  Denies any palpitations, no shortness of breath.  Denies any abdominal pain, no nausea vomiting or diarrhea.    Physical Exam: General:  alert oriented to time, place, and person.  Appear in mild distress, affect appropriate Eyes: PERRLA ENT: Oral Mucosa Clear, moist  Neck: no JVD,  Cardiovascular: S1 and S2 Present, no Murmur,  Respiratory: good respiratory effort, Bilateral Air entry equal and Decreased, no Crackles, no wheezes Abdomen: Bowel Sound present, Soft and no tenderness,  Skin: no rashes  Extremities: no Pedal edema, no calf tenderness Neurologic: without any new focal findings Gait not checked due to patient safety concerns  Vitals:   10/15/21 0538 10/15/21 0811 10/15/21 0951 10/15/21 1129  BP: (!) 150/79 (!) 154/79 135/70 123/68  Pulse: 97 99 99 81  Resp:  18  18  Temp: 98.1 F (36.7 C) 98.6 F (37 C)  98.3 F (36.8 C)  TempSrc: Oral Oral  Oral  SpO2: 99% 99% 100% 99%  Weight:      Height:        Intake/Output Summary (Last 24 hours) at 10/15/2021 1443 Last data filed at 10/15/2021 1700 Gross per 24 hour  Intake 805.48 ml  Output 450 ml  Net 355.48 ml   Filed Weights   10/12/21 1423  Weight: 36.3 kg    Data Reviewed: I have personally reviewed and interpreted daily labs, tele strips, imagings as discussed above. I reviewed all nursing notes, pharmacy notes, vitals, pertinent old records I have discussed plan of care as described above with RN and patient/family.  CBC: Recent Labs  Lab 10/12/21 1424 10/13/21 0739 10/14/21 0447 10/15/21 0521  WBC 7.2 5.4 4.9 5.3  HGB 12.1  11.4* 11.6* 13.6  HCT 36.8 35.4* 35.6* 41.7  MCV 92.2 91.2 90.6 91.2  PLT 170 160 168 176   Basic Metabolic Panel: Recent Labs  Lab 10/12/21 1424 10/13/21 0739 10/14/21 0447 10/15/21 0504 10/15/21 0521  NA 137 139 139  --  141  K 3.7 3.5 3.5  --  3.4*  CL 104 108 108  --  108  CO2 26 22 19*  --  21*  GLUCOSE 126* 78 79  --  69*  BUN 14 11 10   --  9  CREATININE 0.41* 0.39*  0.37* 0.36*  --  0.39*  CALCIUM 8.9 8.0* 8.0*  --  7.9*  MG  --  1.8 1.8 2.0  --   PHOS  --  2.2* 2.2* 3.3  --     Studies: DG ESOPHAGUS W SINGLE CM (SOL OR THIN BA)  Result Date: 10/14/2021 CLINICAL DATA:  Dysphagia EXAM: ESOPHOGRAM/BARIUM SWALLOW TECHNIQUE: Single contrast examination was performed using thin barium. FLUOROSCOPY TIME:  Fluoroscopy Time:  1 minutes 18 seconds Radiation Exposure Index (if provided by the fluoroscopic device): 3 mGy COMPARISON:  None. FINDINGS: Technically limited by restrictions on patient positioning and volume of contrast tolerated. Question of a mucosal exophytic lesion at the level of the hypopharynx displacing contrast. There is persistent narrowing at the distal esophagus with retention of contrast. This partially cleared when the patient was put in the left recumbent position. IMPRESSION: Technically limited. Question of a mucosal exophytic lesion at the level of the hypopharynx displacing contrast. Persistent narrowing at the distal esophagus with contrast retention likely reflects stricture. Presence of mass is also not excluded. Consider cross-sectional imaging to evaluate above. Electronically Signed   By: Guadlupe Spanish M.D.   On: 10/14/2021 15:41    Scheduled Meds:  vitamin C  500 mg Oral Daily   dextrose  12.5 g Intravenous STAT   enoxaparin (LOVENOX) injection  30 mg Subcutaneous Q24H   folic acid  1 mg Oral Daily   iron polysaccharides  150 mg Oral Daily   metoprolol tartrate  12.5 mg Oral BID   multivitamin with minerals  1 tablet Oral Daily   pantoprazole  (PROTONIX) IV  40 mg Intravenous Q12H   phosphorus  500 mg Oral QID   zinc sulfate  220 mg Oral BID   Continuous Infusions:  cefTRIAXone (ROCEPHIN)  IV Stopped (10/14/21 2342)   PRN Meds: acetaminophen **OR** acetaminophen, guaiFENesin-dextromethorphan, HYDROcodone bit-homatropine, ondansetron **OR** ondansetron (ZOFRAN) IV  Time spent: 35 minutes  Author: Gillis Santa. MD [facility] 10/15/2021 2:43 PM  To reach On-call, see care teams to locate the attending and reach out to them via [email or web address]. If 7PM-7AM, please contact night-coverage If you still have difficulty reaching the attending provider, please page the [facility] (Director on Call) for [facility] on amion for assistance.

## 2021-10-16 ENCOUNTER — Encounter
Admission: EM | Disposition: A | Discharge status: Home Health | Payer: Self-pay | Source: Home / Self Care | Attending: Student

## 2021-10-16 ENCOUNTER — Inpatient Hospital Stay: Payer: Medicare Other | Admitting: Anesthesiology

## 2021-10-16 ENCOUNTER — Encounter: Payer: Self-pay | Admitting: Internal Medicine

## 2021-10-16 HISTORY — PX: ESOPHAGOGASTRODUODENOSCOPY (EGD) WITH PROPOFOL: SHX5813

## 2021-10-16 LAB — C-REACTIVE PROTEIN: CRP: 3 mg/dL — ABNORMAL HIGH (ref <1.0)

## 2021-10-16 LAB — BASIC METABOLIC PANEL
Anion gap: 7 (ref 5–15)
BUN: 9 mg/dL (ref 8–23)
CO2: 26 mmol/L (ref 22–32)
Calcium: 8.2 mg/dL — ABNORMAL LOW (ref 8.9–10.3)
Chloride: 106 mmol/L (ref 98–111)
Creatinine, Ser: 0.32 mg/dL — ABNORMAL LOW (ref 0.44–1.00)
GFR, Estimated: >60 mL/min (ref >60)
Glucose, Bld: 94 mg/dL (ref 70–99)
Potassium: 3.3 mmol/L — ABNORMAL LOW (ref 3.5–5.1)
Sodium: 139 mmol/L (ref 135–145)

## 2021-10-16 LAB — MAGNESIUM: Magnesium: 1.8 mg/dL (ref 1.7–2.4)

## 2021-10-16 LAB — CBC
HCT: 33.6 % — ABNORMAL LOW (ref 36.0–46.0)
Hemoglobin: 11.5 g/dL — ABNORMAL LOW (ref 12.0–15.0)
MCH: 30.3 pg (ref 26.0–34.0)
MCHC: 34.2 g/dL (ref 30.0–36.0)
MCV: 88.4 fL (ref 80.0–100.0)
Platelets: 175 10*3/uL (ref 150–400)
RBC: 3.8 MIL/uL — ABNORMAL LOW (ref 3.87–5.11)
RDW: 13.3 % (ref 11.5–15.5)
WBC: 5.1 10*3/uL (ref 4.0–10.5)
nRBC: 0 % (ref 0.0–0.2)

## 2021-10-16 LAB — D-DIMER, QUANTITATIVE: D-Dimer, Quant: 1.57 ug/mL-FEU — ABNORMAL HIGH (ref 0.00–0.50)

## 2021-10-16 LAB — HEPATIC FUNCTION PANEL
ALT: 20 U/L (ref 0–44)
AST: 34 U/L (ref 15–41)
Albumin: 3.1 g/dL — ABNORMAL LOW (ref 3.5–5.0)
Alkaline Phosphatase: 41 U/L (ref 38–126)
Bilirubin, Direct: <0.1 mg/dL (ref 0.0–0.2)
Total Bilirubin: 0.8 mg/dL (ref 0.3–1.2)
Total Protein: 5.3 g/dL — ABNORMAL LOW (ref 6.5–8.1)

## 2021-10-16 LAB — GLUCOSE, CAPILLARY
Glucose-Capillary: 106 mg/dL — ABNORMAL HIGH (ref 70–99)
Glucose-Capillary: 128 mg/dL — ABNORMAL HIGH (ref 70–99)

## 2021-10-16 LAB — PHOSPHORUS: Phosphorus: 2.1 mg/dL — ABNORMAL LOW (ref 2.5–4.6)

## 2021-10-16 SURGERY — ESOPHAGOGASTRODUODENOSCOPY (EGD) WITH PROPOFOL
Anesthesia: Monitor Anesthesia Care

## 2021-10-16 MED ORDER — PHENYLEPHRINE HCL (PRESSORS) 10 MG/ML IV SOLN
INTRAVENOUS | Status: AC
  Filled 2021-10-16: qty 1

## 2021-10-16 MED ORDER — LIDOCAINE 2% (20 MG/ML) 5 ML SYRINGE
INTRAMUSCULAR | Status: DC | PRN
  Administered 2021-10-16: 50 mg via INTRAVENOUS

## 2021-10-16 MED ORDER — OMEPRAZOLE 40 MG PO CPDR
40.0000 mg | DELAYED_RELEASE_CAPSULE | Freq: Every day | ORAL | 11 refills | Status: AC
Start: 2021-10-16 — End: 2024-03-26

## 2021-10-16 MED ORDER — ASCORBIC ACID 500 MG PO TABS
500.0000 mg | ORAL_TABLET | Freq: Every day | ORAL | 2 refills | Status: AC
Start: 2021-10-17 — End: 2022-01-15

## 2021-10-16 MED ORDER — POLYSACCHARIDE IRON COMPLEX 150 MG PO CAPS: 150.0000 mg | ORAL_CAPSULE | Freq: Every day | ORAL | 2 refills | Status: DC

## 2021-10-16 MED ORDER — PANTOPRAZOLE SODIUM 40 MG PO TBEC
40.0000 mg | DELAYED_RELEASE_TABLET | Freq: Every day | ORAL | Status: DC
  Administered 2021-10-16: 11:00:00 40 mg via ORAL
  Filled 2021-10-16: qty 1

## 2021-10-16 MED ORDER — CEFDINIR 300 MG PO CAPS: 300.0000 mg | ORAL_CAPSULE | Freq: Two times a day (BID) | ORAL | 0 refills | Status: AC

## 2021-10-16 MED ORDER — METOPROLOL TARTRATE 25 MG PO TABS: 25.0000 mg | ORAL_TABLET | Freq: Two times a day (BID) | ORAL | Status: DC

## 2021-10-16 MED ORDER — PROPOFOL 500 MG/50ML IV EMUL
INTRAVENOUS | Status: AC
  Filled 2021-10-16: qty 50

## 2021-10-16 MED ORDER — POTASSIUM PHOSPHATES 15 MMOLE/5ML IV SOLN
30.0000 mmol | Freq: Once | INTRAVENOUS | Status: AC
  Administered 2021-10-16: 30 mmol via INTRAVENOUS
  Filled 2021-10-16: qty 10

## 2021-10-16 MED ORDER — PROPOFOL 10 MG/ML IV BOLUS
INTRAVENOUS | Status: DC | PRN
  Administered 2021-10-16: 20 mg via INTRAVENOUS
  Administered 2021-10-16: 100 ug/kg/min via INTRAVENOUS
  Administered 2021-10-16: 40 mg via INTRAVENOUS

## 2021-10-16 MED ORDER — POTASSIUM CHLORIDE 20 MEQ PO PACK
20.0000 meq | PACK | Freq: Once | ORAL | Status: AC
  Administered 2021-10-16: 20 meq via ORAL
  Filled 2021-10-16: qty 1

## 2021-10-16 MED ORDER — METOPROLOL TARTRATE 25 MG PO TABS: 25.0000 mg | ORAL_TABLET | Freq: Two times a day (BID) | ORAL | 2 refills | Status: DC

## 2021-10-16 NOTE — Op Note (Signed)
[facility] Gastroenterology Patient Name: Lindsey Burke Procedure Date: 10/16/2021 7:59 AM MRN: [id number] Account #: [id number] Date of Birth: 1934/07/10 Admit Type: Inpatient Age: 87 Room: [facility] ENDO ROOM 4 Gender: Female Note Status: Finalized Instrument Name: Upper Endoscope [id number] Procedure:             Upper GI endoscopy Indications:           Esophageal dysphagia Providers:             Toney Reil MD, MD Referring MD:          Jillene Bucks. Arlana Pouch, MD (Referring MD) Medicines:             General Anesthesia Complications:         No immediate complications. Estimated blood loss: None. Procedure:             Pre-Anesthesia Assessment:                        - Prior to the procedure, a History and Physical was                         performed, and patient medications and allergies were                         reviewed. The patient is competent. The risks and                         benefits of the procedure and the sedation options and                         risks were discussed with the patient. All questions                         were answered and informed consent was obtained.                         Patient identification and proposed procedure were                         verified by the physician, the nurse, the                         anesthesiologist, the anesthetist and the technician                         in the pre-procedure area in the procedure room in the                         endoscopy suite. Mental Status Examination: alert and                         oriented. Airway Examination: normal oropharyngeal                         airway and neck mobility. Respiratory Examination:                         clear to auscultation. CV Examination: normal.  Prophylactic Antibiotics: The patient does not require                         prophylactic antibiotics. Prior Anticoagulants: The                         patient has  taken no previous anticoagulant or                         antiplatelet agents. ASA Grade Assessment: III - A                         patient with severe systemic disease. After reviewing                         the risks and benefits, the patient was deemed in                         satisfactory condition to undergo the procedure. The                         anesthesia plan was to use general anesthesia.                         Immediately prior to administration of medications,                         the patient was re-assessed for adequacy to receive                         sedatives. The heart rate, respiratory rate, oxygen                         saturations, blood pressure, adequacy of pulmonary                         ventilation, and response to care were monitored                         throughout the procedure. The physical status of the                         patient was re-assessed after the procedure.                        After obtaining informed consent, the endoscope was                         passed under direct vision. Throughout the procedure,                         the patient's blood pressure, pulse, and oxygen                         saturations were monitored continuously. The                         Endosonoscope was introduced through the mouth, and  advanced to the second part of duodenum. The upper GI                         endoscopy was accomplished without difficulty. The                         patient tolerated the procedure well. Findings:      The duodenal bulb and second portion of the duodenum were normal.      The entire examined stomach was normal.      The cardia and gastric fundus were normal on retroflexion.      Esophagogastric landmarks were identified: the gastroesophageal junction       was found at 40 cm from the incisors.      The examined esophagus was normal. A TTS dilator was passed through the       scope.  Empiric Dilation with a 12-13.5-15 mm balloon and a 15-16.5-18 mm       balloon dilator was performed to 18 mm. The dilation site was examined       following endoscope reinsertion and showed mild improvement in luminal       narrowing. Biopsies were taken with a cold forceps for histology. Impression:            - Normal duodenal bulb and second portion of the                         duodenum.                        - Normal stomach.                        - Esophagogastric landmarks identified.                        - Normal esophagus. Dilated. Biopsied. Recommendation:        - Await pathology results.                        - Return patient to hospital ward for ongoing care.                        - Soft diet.                        - Follow an antireflux regimen.                        - Use a proton pump inhibitor PO daily. Procedure Code(s):     --- Professional ---                        [phone], Esophagogastroduodenoscopy, flexible,                         transoral; with transendoscopic balloon dilation of                         esophagus (less than 30 mm diameter)                        43239, 59, Esophagogastroduodenoscopy, flexible,  transoral; with biopsy, single or multiple Diagnosis Code(s):     --- Professional ---                        R13.14, Dysphagia, pharyngoesophageal phase CPT copyright 2019 [employer] Medical Association. All rights reserved. The codes documented in this report are preliminary and upon coder review may  be revised to meet current compliance requirements. Dr. Libby Maw Toney Reil MD, MD 10/16/2021 8:32:57 AM This report has been signed electronically. Number of Addenda: 0 Note Initiated On: 10/16/2021 7:59 AM Estimated Blood Loss:  Estimated blood loss: none. Estimated blood loss: none.      [facility]

## 2021-10-16 NOTE — TOC Transition Note (Signed)
Transition of Care [facility]) - CM/SW Discharge Note   Patient Details  Name: Lindsey Burke MRN: [id number] Date of Birth: [birth year]  Transition of [facility] CM/SW Contact:  Liliana Cline, LCSW Phone Number: 10/16/2021, 2:08 PM   Clinical Narrative:    Patient has orders to DC home. CSW notified [name] with [facility].   Final next level of care: Home w Home Health Services Barriers to Discharge: Barriers Resolved   Patient Goals and CMS Choice Patient states their goals for this hospitalization and ongoing recovery are:: home with home health CMS Medicare.gov Compare Post Acute Care list provided to:: Patient Represenative (must comment) Choice offered to / list presented to : Adult Children  Discharge Placement                       Discharge Plan and Services                DME Arranged: Walker rolling DME Agency: [employer] Date DME Agency Contacted: 10/15/21   Representative spoke with at DME Agency: Leavy Cella HH Arranged: PT, RN HH Agency: [facility] Date [facility] Agency Contacted: 10/16/21   Representative spoke with at [facility] Agency: [name]  Social Determinants of Health (SDOH) Interventions     Readmission Risk Interventions No flowsheet data found.

## 2021-10-16 NOTE — Progress Notes (Signed)
Patient being discharged home with daughter. Ivs removed. Went over discharge instructions and medications with patiens daughter. She stated that she understood and all questions were answered. Patient going home with daughter POV.

## 2021-10-16 NOTE — Transfer of Care (Addendum)
Immediate Anesthesia Transfer of Care Note  Patient: Lindsey Burke  Procedure(s) Performed: ESOPHAGOGASTRODUODENOSCOPY (EGD) WITH PROPOFOL  Patient Location: Endoscopy Unit  Anesthesia Type:General  Level of Consciousness: sedated  Airway & Oxygen Therapy: Patient Spontanous Breathing and Patient connected to nasal cannula oxygen  Post-op Assessment: Report given to RN and Post -op Vital signs reviewed and stable  Post vital signs: Reviewed and stable  Last Vitals:  Vitals Value Taken Time  BP 182/86   Temp    Pulse 88   Resp 10   SpO2 97     Last Pain:  Vitals:   10/16/21 0754  TempSrc: Temporal  PainSc: 0-No pain         Complications: No notable events documented.

## 2021-10-16 NOTE — Discharge Summary (Signed)
[facility] Discharge Summary   Patient: Lindsey Burke UJW:[id number]  PCP: Jaclyn Shaggy, MD  Date of admission: 10/12/2021   Date of discharge:  10/16/2021     Discharge Diagnoses:  Principal Problem:   Generalized weakness Active Problems:   COVID-19 virus infection   Rhabdomyolysis   UTI (urinary tract infection)   COPD (chronic obstructive pulmonary disease) (HCC)   Elevated troponin   Underweight   Dysphagia   Admitted From: Home Disposition:  Home with home health PT  Recommendations for Outpatient Follow-up:  PCP: In 1 week Follow up LABS/TEST:     Follow-up Information     [facility] EMERGENCY DEPARTMENT.   Specialty: Emergency Medicine Why: If symptoms worsen Contact information: [address] [id number] [place] [address] [phone]        Schedule an appointment as soon as possible for a visit  with Jaclyn Shaggy, MD.   Specialty: Internal Medicine Contact information: [address]   [place] [address] [phone]                Diet recommendation: Dysphagia type 3 Thin Liquid  Activity: The patient is advised to gradually reintroduce usual activities, as tolerated  Discharge Condition: stable  Code Status: Full code   History of present illness: As per the H and P dictated on admission Hospital Course:  Lindsey Burke is a 87 y.o. female with medical history significant for COPD and who lives independently who was in her usual state of health until 2 days prior when she was noted to be very weak to where she is herself onto the floor and could not get up for several hours.  She was able to eventually called her daughter who called rescue and they were able to help her up.  On the day of arrival she continued to be very weak to where she could not get out of bed.  They eventually decided to come into the ED.  She admits to recently having a cough but denies  shortness of breath, fever, nausea, vomiting, abdominal pain or diarrhea.  She denies chest pain. ED course: Tachycardic at 107 with a low-grade temp of 99 with otherwise normal vitals Blood work significant for normal WBC and hemoglobin Troponin 20-24, Total CK 248, CBC and CMP mostly unremarkable UA with greater than 160 ketones positive nitrites and many bacteria COVID-positive EKG, personally viewed and interpreted: Sinus tachycardia at 104 with no acute ST-T wave changes Imaging: CT head nonacute, chest x-ray with no edema infiltrate or effusion Patient treated with an LR bolus and given Rocephin.  Hospitalist consulted for admission   Assessment and Plan: Dysphagia, suspected distal esophageal stricture, presented with dysphagia and chest pain Continue aspiration precautions. SLP eval done, recommended dysphagia 3 diet. Esophagogram, ?? mucosal exophytic lesion at the level of the hypopharynx displacing contrast. Persistent narrowing at the distal esophagus with contrast retention likely reflects stricture. Presence of mass is also not excluded. GI consulted, s/p EGD, stomach, normal esophagus, dilated, biopsy done, follow biopsy report.  Duodenum normal.  Recommended PPI 40 mg daily, soft diet.  Follow-up as an outpatient if persistent issues. Due to poor oral intake patient had hypoglycemic episode which resolved.  Patient was advised to continue to take protein diet. E. coli UTI (urinary tract infection), s/p IV Rocephin, transition to oral Omnicef 300 twice daily for 5 additional days.  cultures growing E. coli, pan sensitivity COVID-19 virus infection, Chest x-ray  clear and O2 sat normal on room air, Supportive care Suspecting incidental, Patient was started on remdesivir x3 doses, s/p vitamin C and zinc given during hospital stay.  Continue isolation for total 10 days.  D-dimer elevated most likely due to COVID infection, no signs and symptoms of DVT/PE, no tachycardia no hypoxia no  shortness of breath no chest pain. Elevated troponin, most likely demand ischemia secondary to COVID infection. Troponin 20-24 and likely related to demand ischemia. Patient denies chest pain and EKG is nonacute TTE shows LVEF 60% grade 1 diastolic dysfunction, no LV wall motion abnormality. Generalized weakness, Secondary to acute illness and poor oral intake, Continue fall precautions. S/p IV hydration, seen by Nutritionist recommended dysphagia 3 diet and PT eval done recommended home PT. CK 248 slightly elevated, recommended to continue oral hydration. COPD (chronic obstructive pulmonary disease), Not acutely exacerbated, DuoNeb as needed Underweight, Nutritionist evaluation, recommended dysphagia 3 diet. Iron deficiency, started oral iron supplement with vitamin C, Folic acid within normal range,  vitamin B12 >2K and vitamin D levels  82 wnl Hypophosphatemia most likely due to poor nutrition. Phosphorus repleted. Body mass index is 15.62 kg/m.  Interventions:   Patient was seen by physical therapy, who recommended Home health, which was arranged. On the day of the discharge the patient's vitals were stable, and no other acute medical condition were reported by patient. the patient was felt safe to be discharge at Home with Home health.  Consultants: GI Procedures: s/p EGD  Discharge Exam: General: Appear in no distress, no Rash; Oral Mucosa Clear, moist. Cardiovascular: S1 and S2 Present, no Murmur, Respiratory: normal respiratory effort, Bilateral Air entry present and no Crackles, no wheezes Abdomen: Bowel Sound present, Soft and no tenderness, no hernia Extremities: no Pedal edema, no calf tenderness Neurology: alert and oriented to time, place, and person affect appropriate.  Filed Weights   10/12/21 1423  Weight: 36.3 kg   Vitals:   10/16/21 0845 10/16/21 1144  BP:  (!) 189/92  Pulse:  94  Resp:  18  Temp:  98.6 F (37 C)  SpO2: 93% 98%    DISCHARGE  MEDICATION: Allergies as of 10/16/2021       Reactions   Augmentin [amoxicillin-pot Clavulanate] Other (See Comments)   Elevated Liver Enzymes        Medication List     TAKE these medications    alendronate 70 MG tablet Commonly known as: FOSAMAX Take 1 tablet by mouth once a week.   ascorbic acid 500 MG tablet Commonly known as: VITAMIN C Take 1 tablet (500 mg total) by mouth daily. Start taking on: October 17, 2021   aspirin EC 81 MG tablet Take 81 mg by mouth daily.   cefdinir 300 MG capsule Commonly known as: OMNICEF Take 1 capsule (300 mg total) by mouth 2 (two) times daily for 5 days.   Fish Oil 1000 MG Caps Take 1 capsule by mouth daily.   gabapentin 300 MG capsule Commonly known as: NEURONTIN Take 1 capsule by mouth at bedtime.   iron polysaccharides 150 MG capsule Commonly known as: NIFEREX Take 1 capsule (150 mg total) by mouth daily. Start taking on: October 17, 2021   LORazepam 0.5 MG tablet Commonly known as: ATIVAN Take 1 tablet by mouth every 8 (eight) hours as needed.   metoprolol tartrate 25 MG tablet Commonly known as: LOPRESSOR Take 1 tablet (25 mg total) by mouth 2 (two) times daily.   omeprazole 40 MG capsule Commonly known as:  PRILOSEC Take 1 capsule (40 mg total) by mouth daily. What changed:  medication strength how much to take   OS-CAL PO Take 1 tablet by mouth daily.   PRESERVISION AREDS PO Take 1 tablet by mouth daily.               Durable Medical Equipment  (From admission, onward)           Start     Ordered   10/15/21 1000  For home use only DME Walker  Once       Question:  Patient needs a walker to treat with the following condition  Answer:  Age-related physical debility   10/15/21 1000           Allergies  Allergen Reactions   Augmentin [Amoxicillin-Pot Clavulanate] Other (See Comments)    Elevated Liver Enzymes   Discharge Instructions     Call MD for:  difficulty breathing,  headache or visual disturbances   Complete by: As directed    Call MD for:  extreme fatigue   Complete by: As directed    Call MD for:  persistant dizziness or light-headedness   Complete by: As directed    Call MD for:  persistant nausea and vomiting   Complete by: As directed    Call MD for:  severe uncontrolled pain   Complete by: As directed    Call MD for:  temperature >100.4   Complete by: As directed    Diet - low sodium heart healthy   Complete by: As directed    Discharge instructions   Complete by: As directed    Follow with PCP in 1 week, repeat CBC and BMP after 1 week Follow with GI 1 to 2 months if persistent problem with dysphagia   Increase activity slowly   Complete by: As directed        The results of significant diagnostics from this hospitalization (including imaging, microbiology, ancillary and laboratory) are listed below for reference.    Significant Diagnostic Studies: CT HEAD WO CONTRAST ( )  Result Date: 10/12/2021 CLINICAL DATA:  A female at age 56 presents for evaluation of middle status changes of unknown cause. EXAM: CT HEAD WITHOUT CONTRAST TECHNIQUE: Contiguous axial images were obtained from the base of the skull through the vertex without intravenous contrast. COMPARISON:  Is comparison made with Mar 13, 2021. FINDINGS: Brain: No evidence of acute infarction, hemorrhage, hydrocephalus, extra-axial collection or mass lesion/mass effect. Signs of atrophy as before. Vascular: No hyperdense vessel or unexpected calcification. Skull: Normal. Negative for fracture or focal lesion. Sinuses/Orbits: Visualized paranasal sinuses and orbits show no acute process. Other: None IMPRESSION: No acute intracranial pathology. Signs of atrophy as before. Electronically Signed   By: Donzetta Kohut M.D.   On: 10/12/2021 14:49   DG Chest Portable 1 View  Result Date: 10/12/2021 CLINICAL DATA:  Weakness. EXAM: PORTABLE CHEST 1 VIEW COMPARISON:  Chest x-ray dated October 11, 2012. FINDINGS: The heart size and mediastinal contours are within normal limits. Normal pulmonary vascularity. The lungs remain hyperinflated. No focal consolidation, pleural effusion, or pneumothorax. No acute osseous abnormality. IMPRESSION: No active disease. Electronically Signed   By: Obie Dredge M.D.   On: 10/12/2021 16:48   ECHOCARDIOGRAM COMPLETE  Result Date: 10/14/2021    ECHOCARDIOGRAM REPORT   Patient Name:   TYIA BINFORD Date of Exam: 10/14/2021 Medical Rec #:  [id number]           Height:  60.0 in Accession #:    [id number]          Weight:       80.0 lb Date of Birth:  [birth year]            BSA:          1.265 m Patient Age:    87 years            BP:           149/83 mmHg Patient Gender: F                   HR:           96 bpm. Exam Location:  [facility] Procedure: 2D Echo, Color Doppler and Cardiac Doppler Indications:     R07.9 Chest Pain  History:         Patient has no prior history of Echocardiogram examinations.                  COPD; Risk Factors:Dyslipidemia. Pt tested positive for                  COVID-19.  Sonographer:     Humphrey Rolls Referring Phys:  [id number] Gillis Santa Diagnosing Phys: Julien Nordmann MD  Sonographer Comments: Suboptimal parasternal window. Image acquisition challenging due to breast implants and Image acquisition challenging due to COPD. IMPRESSIONS  1. Left ventricular ejection fraction, by estimation, is 60 to 65%. The left ventricle has normal function. The left ventricle has no regional wall motion abnormalities. There is mild left ventricular hypertrophy. Left ventricular diastolic parameters are consistent with Grade I diastolic dysfunction (impaired relaxation). The average left ventricular global longitudinal strain is -13.8 %. The global longitudinal strain is abnormal.  2. Right ventricular systolic function is normal. The right ventricular size is normal. Tricuspid regurgitation signal is inadequate for assessing PA pressure.  3. The mitral  valve is normal in structure. Mild mitral valve regurgitation. No evidence of mitral stenosis.  4. The aortic valve is normal in structure. Aortic valve regurgitation is not visualized. No aortic stenosis is present.  5. The inferior vena cava is normal in size with greater than 50% respiratory variability, suggesting right atrial pressure of 3 mmHg. FINDINGS  Left Ventricle: Left ventricular ejection fraction, by estimation, is 60 to 65%. The left ventricle has normal function. The left ventricle has no regional wall motion abnormalities. The average left ventricular global longitudinal strain is -13.8 %. The global longitudinal strain is abnormal. The left ventricular internal cavity size was normal in size. There is mild left ventricular hypertrophy. Left ventricular diastolic parameters are consistent with Grade I diastolic dysfunction (impaired relaxation). Right Ventricle: The right ventricular size is normal. No increase in right ventricular wall thickness. Right ventricular systolic function is normal. Tricuspid regurgitation signal is inadequate for assessing PA pressure. Left Atrium: Left atrial size was normal in size. Right Atrium: Right atrial size was normal in size. Pericardium: There is no evidence of pericardial effusion. Mitral Valve: The mitral valve is normal in structure. There is mild calcification of the mitral valve leaflet(s). Mild mitral valve regurgitation. No evidence of mitral valve stenosis. MV peak gradient, 3.1 mmHg. The mean mitral valve gradient is 2.0 mmHg. Tricuspid Valve: The tricuspid valve is normal in structure. Tricuspid valve regurgitation is not demonstrated. No evidence of tricuspid stenosis. Aortic Valve: The aortic valve is normal in structure. Aortic valve regurgitation is not visualized. No aortic stenosis is present. Aortic valve mean  gradient measures 4.0 mmHg. Aortic valve peak gradient measures 7.1 mmHg. Aortic valve area, by VTI measures 2.64 cm. Pulmonic Valve:  The pulmonic valve was normal in structure. Pulmonic valve regurgitation is not visualized. No evidence of pulmonic stenosis. Aorta: The aortic root is normal in size and structure. Venous: The inferior vena cava is normal in size with greater than 50% respiratory variability, suggesting right atrial pressure of 3 mmHg. IAS/Shunts: No atrial level shunt detected by color flow Doppler.  LEFT VENTRICLE PLAX 2D LVIDd:         3.70 cm     Diastology LVIDs:         2.20 cm     LV e' medial:    5.00 cm/s LV PW:         0.60 cm     LV E/e' medial:  15.0 LV IVS:        0.80 cm     LV e' lateral:   9.25 cm/s LVOT diam:     2.00 cm     LV E/e' lateral: 8.1 LV SV:         64 LV SV Index:   51          2D Longitudinal Strain LVOT Area:     3.14 cm    2D Strain GLS Avg:     -13.8 %  LV Volumes (MOD) LV vol d, MOD A2C: 33.7 ml LV vol d, MOD A4C: 47.7 ml LV vol s, MOD A2C: 15.5 ml LV vol s, MOD A4C: 25.1 ml LV SV MOD A2C:     18.2 ml LV SV MOD A4C:     47.7 ml LV SV MOD BP:      20.5 ml RIGHT VENTRICLE RV Basal diam:  3.10 cm RV S prime:     10.20 cm/s LEFT ATRIUM             Index        RIGHT ATRIUM           Index LA diam:        2.10 cm 1.66 cm/m   RA Area:     12.90 cm LA Vol (A2C):   35.6 ml 28.15 ml/m  RA Volume:   32.00 ml  25.31 ml/m LA Vol (A4C):   45.9 ml 36.30 ml/m LA Biplane Vol: 40.8 ml 32.26 ml/m  AORTIC VALVE                    PULMONIC VALVE AV Area (Vmax):    2.83 cm     PV Vmax:       0.95 m/s AV Area (Vmean):   2.80 cm     PV Vmean:      64.400 cm/s AV Area (VTI):     2.64 cm     PV VTI:        0.179 m AV Vmax:           133.00 cm/s  PV Peak grad:  3.6 mmHg AV Vmean:          93.000 cm/s  PV Mean grad:  2.0 mmHg AV VTI:            0.244 m AV Peak Grad:      7.1 mmHg AV Mean Grad:      4.0 mmHg LVOT Vmax:         120.00 cm/s LVOT Vmean:        83.000 cm/s LVOT VTI:  0.205 m LVOT/AV VTI ratio: 0.84  AORTA Ao Root diam: 2.50 cm MITRAL VALVE MV Area (PHT): 4.83 cm    SHUNTS MV Area VTI:   3.58  cm    Systemic VTI:  0.20 m MV Peak grad:  3.1 mmHg    Systemic Diam: 2.00 cm MV Mean grad:  2.0 mmHg MV Vmax:       0.88 m/s MV Vmean:      61.3 cm/s MV Decel Time: 157 msec MV E velocity: 75.00 cm/s MV A velocity: 87.80 cm/s MV E/A ratio:  0.85 Julien Nordmann MD Electronically signed by Julien Nordmann MD Signature Date/Time: 10/14/2021/6:29:01 PM    Final    DG ESOPHAGUS W SINGLE CM (SOL OR THIN BA)  Result Date: 10/14/2021 CLINICAL DATA:  Dysphagia EXAM: ESOPHOGRAM/BARIUM SWALLOW TECHNIQUE: Single contrast examination was performed using thin barium. FLUOROSCOPY TIME:  Fluoroscopy Time:  1 minutes 18 seconds Radiation Exposure Index (if provided by the fluoroscopic device): 3 mGy COMPARISON:  None. FINDINGS: Technically limited by restrictions on patient positioning and volume of contrast tolerated. Question of a mucosal exophytic lesion at the level of the hypopharynx displacing contrast. There is persistent narrowing at the distal esophagus with retention of contrast. This partially cleared when the patient was put in the left recumbent position. IMPRESSION: Technically limited. Question of a mucosal exophytic lesion at the level of the hypopharynx displacing contrast. Persistent narrowing at the distal esophagus with contrast retention likely reflects stricture. Presence of mass is also not excluded. Consider cross-sectional imaging to evaluate above. Electronically Signed   By: Guadlupe Spanish M.D.   On: 10/14/2021 15:41    Microbiology: Recent Results (from the past 240 hour(s))  Urine Culture     Status: Abnormal   Collection Time: 10/12/21  2:24 PM   Specimen: Urine, Clean Catch  Result Value Ref Range Status   Specimen Description   Final    URINE, CLEAN CATCH Performed at [facility], [address] [place], [place] [address]    Special Requests   Final    NONE Performed at [facility], [address] [place], [place] [address]    Culture >=100,000 COLONIES/mL  ESCHERICHIA COLI (A)  Final   Report Status 10/15/2021 FINAL  Final   Organism ID, Bacteria ESCHERICHIA COLI (A)  Final      Susceptibility   Escherichia coli - MIC*    AMPICILLIN 4 SENSITIVE Sensitive     CEFAZOLIN <=4 SENSITIVE Sensitive     CEFEPIME <=0.12 SENSITIVE Sensitive     CEFTRIAXONE <=0.25 SENSITIVE Sensitive     CIPROFLOXACIN <=0.25 SENSITIVE Sensitive     GENTAMICIN <=1 SENSITIVE Sensitive     IMIPENEM <=0.25 SENSITIVE Sensitive     NITROFURANTOIN <=16 SENSITIVE Sensitive     TRIMETH/SULFA <=20 SENSITIVE Sensitive     AMPICILLIN/SULBACTAM <=2 SENSITIVE Sensitive     PIP/TAZO <=4 SENSITIVE Sensitive     * >=100,000 COLONIES/mL ESCHERICHIA COLI  Resp Panel by RT-PCR (Flu A&B, Covid) Nasopharyngeal Swab     Status: Abnormal   Collection Time: 10/12/21 10:33 PM   Specimen: Nasopharyngeal Swab; Nasopharyngeal(NP) swabs in vial transport medium  Result Value Ref Range Status   SARS Coronavirus 2 by RT PCR POSITIVE (A) NEGATIVE Final    Comment: RESULT CALLED TO, READ BACK BY AND VERIFIED WITH: ALYCIA BEVERLY@[id number] /8/22 RH (NOTE) SARS-CoV-2 target nucleic acids are DETECTED.  The SARS-CoV-2 RNA is generally detectable in upper respiratory specimens during the acute phase of infection. Positive results  are indicative of the presence of the identified virus, but do not rule out bacterial infection or co-infection with other pathogens not detected by the test. Clinical correlation with patient history and other diagnostic information is necessary to determine patient infection status. The expected result is Negative.  Fact Sheet for Patients: BloggerCourse.com  Fact Sheet for Healthcare Providers: SeriousBroker.it  This test is not yet approved or cleared by the [place] FDA and  has been authorized for detection and/or diagnosis of SARS-CoV-2 by FDA under an Emergency Use Authorization (EUA).  This EUA will remain  in effect (meaning this test can be use d) for the duration of  the COVID-19 declaration under Section 564(b)(1) of the Act, 21 U.S.C. section 360bbb-3(b)(1), unless the authorization is terminated or revoked sooner.     Influenza A by PCR NEGATIVE NEGATIVE Final   Influenza B by PCR NEGATIVE NEGATIVE Final    Comment: (NOTE) The Xpert Xpress SARS-CoV-2/FLU/RSV plus assay is intended as an aid in the diagnosis of influenza from Nasopharyngeal swab specimens and should not be used as a sole basis for treatment. Nasal washings and aspirates are unacceptable for Xpert Xpress SARS-CoV-2/FLU/RSV testing.  Fact Sheet for Patients: BloggerCourse.com  Fact Sheet for Healthcare Providers: SeriousBroker.it  This test is not yet approved or cleared by the [place] FDA and has been authorized for detection and/or diagnosis of SARS-CoV-2 by FDA under an Emergency Use Authorization (EUA). This EUA will remain in effect (meaning this test can be used) for the duration of the COVID-19 declaration under Section 564(b)(1) of the Act, 21 U.S.C. section 360bbb-3(b)(1), unless the authorization is terminated or revoked.  Performed at [facility], [address] [place], [place] [address]      Labs: CBC: Recent Labs  Lab 10/12/21 1424 10/13/21 0739 10/14/21 0447 10/15/21 0521 10/16/21 0647  WBC 7.2 5.4 4.9 5.3 5.1  HGB 12.1 11.4* 11.6* 13.6 11.5*  HCT 36.8 35.4* 35.6* 41.7 33.6*  MCV 92.2 91.2 90.6 91.2 88.4  PLT 170 160 168 176 175   Basic Metabolic Panel: Recent Labs  Lab 10/12/21 1424 10/13/21 0739 10/14/21 0447 10/15/21 0504 10/15/21 0521 10/16/21 0647  NA 137 139 139  --  141 139  K 3.7 3.5 3.5  --  3.4* 3.3*  CL 104 108 108  --  108 106  CO2 26 22 19*  --  21* 26  GLUCOSE 126* 78 79  --  69* 94  BUN 14 11 10   --  9 9  CREATININE 0.41* 0.39*  0.37* 0.36*  --  0.39* 0.32*  CALCIUM 8.9 8.0* 8.0*  --  7.9*  8.2*  MG  --  1.8 1.8 2.0  --  1.8  PHOS  --  2.2* 2.2* 3.3  --  2.1*   Liver Function Tests: Recent Labs  Lab 10/12/21 1424 10/14/21 0447 10/15/21 0521 10/16/21 0647  AST 34 32 40 34  ALT 20 20 21 20   ALKPHOS 45 40 46 41  BILITOT 0.8 0.7 0.9 0.8  PROT 6.8 5.6* 6.0* 5.3*  ALBUMIN 4.0 3.1* 3.4* 3.1*   No results for input(s): LIPASE, AMYLASE in the last 168 hours. No results for input(s): AMMONIA in the last 168 hours. Cardiac Enzymes: Recent Labs  Lab 10/12/21 1424 10/14/21 0447  CKTOTAL 248* 225   BNP (last 3 results) No results for input(s): BNP in the last 8760 hours. CBG: Recent Labs  Lab 10/15/21 0951 10/15/21 1402 10/15/21 1658 10/15/21 2004 10/16/21 1137  GLUCAP 86 169* 116* 110* 106*    Time spent: 35 minutes  Signed:  Gillis Santa  [facility]  10/16/2021 12:48 PM

## 2021-10-16 NOTE — Anesthesia Preprocedure Evaluation (Addendum)
Anesthesia Evaluation  Patient identified by MRN, date of birth, ID band  Airway Mallampati: II  TM Distance: >3 FB     Dental no notable dental hx.    Pulmonary COPD,  COVID + 12/7 on admission   Pulmonary exam normal        Cardiovascular Normal cardiovascular exam     Neuro/Psych    GI/Hepatic GERD  ,Dysphagia with ? Distal esophageal stricture vs. Mass.  Pt very weak with weight loss and malnutrition.   Endo/Other    Renal/GU Renal disease (hx of kidney stones)     Musculoskeletal   Abdominal   Peds  Hematology   Anesthesia Other Findings   Reproductive/Obstetrics                            Anesthesia Physical Anesthesia Plan  ASA: 3  Anesthesia Plan: MAC   Post-op Pain Management: Minimal or no pain anticipated   Induction: Intravenous  PONV Risk Score and Plan: 2  Airway Management Planned:   Additional Equipment:   Intra-op Plan:   Post-operative Plan:   Informed Consent:   Plan Discussed with:   Anesthesia Plan Comments:         Anesthesia Quick Evaluation

## 2021-10-17 ENCOUNTER — Encounter: Payer: Self-pay | Admitting: Gastroenterology

## 2021-10-19 LAB — SURGICAL PATHOLOGY

## 2022-02-27 ENCOUNTER — Other Ambulatory Visit: Payer: Self-pay | Admitting: Internal Medicine

## 2022-02-27 DIAGNOSIS — Z1231 Encounter for screening mammogram for malignant neoplasm of breast: Secondary | ICD-10-CM

## 2022-03-29 ENCOUNTER — Ambulatory Visit
Admission: RE | Admit: 2022-03-29 | Discharge: 2022-03-29 | Disposition: A | Discharge status: Home / Self Care | Payer: Medicare Other | Source: Ambulatory Visit | Attending: Internal Medicine | Admitting: Internal Medicine

## 2022-03-29 DIAGNOSIS — Z1231 Encounter for screening mammogram for malignant neoplasm of breast: Secondary | ICD-10-CM | POA: Diagnosis present

## 2022-04-01 NOTE — Anesthesia Postprocedure Evaluation (Signed)
Anesthesia Post Note  Patient: Elajah Kunsman  Procedure(s) Performed: ESOPHAGOGASTRODUODENOSCOPY (EGD) WITH PROPOFOL  Patient location during evaluation: PACU Anesthesia Type: MAC Level of consciousness: awake Pain management: pain level controlled Vital Signs Assessment: post-procedure vital signs reviewed and stable Respiratory status: spontaneous breathing Cardiovascular status: blood pressure returned to baseline Anesthetic complications: no Comments: Patient seen in PACU. No issues; VSS, see flowsheet for details.  (note placed after the fact)   No notable events documented.   Last Vitals:  Vitals:   10/16/21 0845 10/16/21 1144  BP:  (!) 189/92  Pulse:  94  Resp:  18  Temp:  37 C  SpO2: 93% 98%    Last Pain:  Vitals:   10/16/21 1455  TempSrc:   PainSc: 0-No pain                 Harrie Foreman

## 2022-04-25 ENCOUNTER — Emergency Department: Payer: Medicare Other

## 2022-04-25 ENCOUNTER — Other Ambulatory Visit: Payer: Self-pay

## 2022-04-25 ENCOUNTER — Observation Stay
Admission: EM | Admit: 2022-04-25 | Discharge: 2022-04-27 | Disposition: A | Discharge status: Home Health | Payer: Medicare Other | Attending: Internal Medicine | Admitting: Internal Medicine

## 2022-04-25 DIAGNOSIS — N390 Urinary tract infection, site not specified: Principal | ICD-10-CM | POA: Insufficient documentation

## 2022-04-25 DIAGNOSIS — Z79899 Other long term (current) drug therapy: Secondary | ICD-10-CM | POA: Diagnosis not present

## 2022-04-25 DIAGNOSIS — Z20822 Contact with and (suspected) exposure to covid-19: Secondary | ICD-10-CM | POA: Diagnosis not present

## 2022-04-25 DIAGNOSIS — J449 Chronic obstructive pulmonary disease, unspecified: Secondary | ICD-10-CM | POA: Diagnosis not present

## 2022-04-25 DIAGNOSIS — Z23 Encounter for immunization: Secondary | ICD-10-CM | POA: Insufficient documentation

## 2022-04-25 DIAGNOSIS — B9620 Unspecified Escherichia coli [E. coli] as the cause of diseases classified elsewhere: Secondary | ICD-10-CM | POA: Insufficient documentation

## 2022-04-25 DIAGNOSIS — R7401 Elevation of levels of liver transaminase levels: Secondary | ICD-10-CM | POA: Insufficient documentation

## 2022-04-25 DIAGNOSIS — R778 Other specified abnormalities of plasma proteins: Secondary | ICD-10-CM | POA: Insufficient documentation

## 2022-04-25 DIAGNOSIS — N3000 Acute cystitis without hematuria: Secondary | ICD-10-CM | POA: Diagnosis not present

## 2022-04-25 DIAGNOSIS — Z7982 Long term (current) use of aspirin: Secondary | ICD-10-CM | POA: Insufficient documentation

## 2022-04-25 DIAGNOSIS — D509 Iron deficiency anemia, unspecified: Secondary | ICD-10-CM | POA: Diagnosis present

## 2022-04-25 DIAGNOSIS — R7989 Other specified abnormal findings of blood chemistry: Secondary | ICD-10-CM | POA: Diagnosis present

## 2022-04-25 DIAGNOSIS — Y92009 Unspecified place in unspecified non-institutional (private) residence as the place of occurrence of the external cause: Secondary | ICD-10-CM | POA: Diagnosis not present

## 2022-04-25 DIAGNOSIS — W19XXXA Unspecified fall, initial encounter: Secondary | ICD-10-CM

## 2022-04-25 DIAGNOSIS — R109 Unspecified abdominal pain: Secondary | ICD-10-CM | POA: Diagnosis not present

## 2022-04-25 DIAGNOSIS — G9341 Metabolic encephalopathy: Secondary | ICD-10-CM | POA: Diagnosis not present

## 2022-04-25 DIAGNOSIS — R350 Frequency of micturition: Secondary | ICD-10-CM | POA: Diagnosis present

## 2022-04-25 DIAGNOSIS — R410 Disorientation, unspecified: Secondary | ICD-10-CM

## 2022-04-25 LAB — URINALYSIS, COMPLETE (UACMP) WITH MICROSCOPIC
Bilirubin Urine: NEGATIVE
Glucose, UA: NEGATIVE mg/dL
Ketones, ur: 20 mg/dL — AB
Nitrite: NEGATIVE
Protein, ur: 100 mg/dL — AB
RBC / HPF: >50 RBC/hpf — ABNORMAL HIGH (ref 0–5)
Specific Gravity, Urine: 1.018 (ref 1.005–1.030)
WBC, UA: >50 WBC/hpf — ABNORMAL HIGH (ref 0–5)
pH: 5 (ref 5.0–8.0)

## 2022-04-25 LAB — CBC
HCT: 37.5 % (ref 36.0–46.0)
Hemoglobin: 11.9 g/dL — ABNORMAL LOW (ref 12.0–15.0)
MCH: 30.1 pg (ref 26.0–34.0)
MCHC: 31.7 g/dL (ref 30.0–36.0)
MCV: 94.7 fL (ref 80.0–100.0)
Platelets: 205 10*3/uL (ref 150–400)
RBC: 3.96 MIL/uL (ref 3.87–5.11)
RDW: 12.7 % (ref 11.5–15.5)
WBC: 7.7 10*3/uL (ref 4.0–10.5)
nRBC: 0 % (ref 0.0–0.2)

## 2022-04-25 LAB — SARS CORONAVIRUS 2 BY RT PCR: SARS Coronavirus 2 by RT PCR: NEGATIVE

## 2022-04-25 LAB — HEMOGLOBIN A1C
Hgb A1c MFr Bld: 5.6 % (ref 4.8–5.6)
Mean Plasma Glucose: 114.02 mg/dL

## 2022-04-25 LAB — TROPONIN I (HIGH SENSITIVITY)
Troponin I (High Sensitivity): 19 ng/L — ABNORMAL HIGH (ref <18)
Troponin I (High Sensitivity): 19 ng/L — ABNORMAL HIGH (ref <18)
Troponin I (High Sensitivity): 21 ng/L — ABNORMAL HIGH (ref <18)

## 2022-04-25 LAB — COMPREHENSIVE METABOLIC PANEL
ALT: 61 U/L — ABNORMAL HIGH (ref 0–44)
AST: 59 U/L — ABNORMAL HIGH (ref 15–41)
Albumin: 3 g/dL — ABNORMAL LOW (ref 3.5–5.0)
Alkaline Phosphatase: 72 U/L (ref 38–126)
Anion gap: 9 (ref 5–15)
BUN: 16 mg/dL (ref 8–23)
CO2: 26 mmol/L (ref 22–32)
Calcium: 9 mg/dL (ref 8.9–10.3)
Chloride: 106 mmol/L (ref 98–111)
Creatinine, Ser: 0.51 mg/dL (ref 0.44–1.00)
GFR, Estimated: >60 mL/min (ref >60)
Glucose, Bld: 115 mg/dL — ABNORMAL HIGH (ref 70–99)
Potassium: 3.5 mmol/L (ref 3.5–5.1)
Sodium: 141 mmol/L (ref 135–145)
Total Bilirubin: 0.7 mg/dL (ref 0.3–1.2)
Total Protein: 6.5 g/dL (ref 6.5–8.1)

## 2022-04-25 LAB — CK: Total CK: 70 U/L (ref 38–234)

## 2022-04-25 LAB — AMMONIA: Ammonia: <10 umol/L (ref 9–35)

## 2022-04-25 MED ORDER — DM-GUAIFENESIN ER 30-600 MG PO TB12: 1.0000 | ORAL_TABLET | Freq: Two times a day (BID) | ORAL | Status: DC | PRN

## 2022-04-25 MED ORDER — ASPIRIN 81 MG PO TBEC
81.0000 mg | DELAYED_RELEASE_TABLET | Freq: Every day | ORAL | Status: DC
  Administered 2022-04-26 – 2022-04-27 (×2): 81 mg via ORAL
  Filled 2022-04-25 (×2): qty 1

## 2022-04-25 MED ORDER — GABAPENTIN 300 MG PO CAPS
300.0000 mg | ORAL_CAPSULE | Freq: Every day | ORAL | Status: DC
  Administered 2022-04-25 – 2022-04-26 (×2): 300 mg via ORAL
  Filled 2022-04-25 (×2): qty 1

## 2022-04-25 MED ORDER — IBUPROFEN 400 MG PO TABS: 200.0000 mg | ORAL_TABLET | Freq: Four times a day (QID) | ORAL | Status: DC | PRN

## 2022-04-25 MED ORDER — SODIUM CHLORIDE 0.9 % IV SOLN
1.0000 g | Freq: Once | INTRAVENOUS | Status: AC
  Administered 2022-04-25: 1 g via INTRAVENOUS
  Filled 2022-04-25: qty 10

## 2022-04-25 MED ORDER — FERROUS SULFATE 325 (65 FE) MG PO TABS
325.0000 mg | ORAL_TABLET | Freq: Three times a day (TID) | ORAL | Status: DC
  Administered 2022-04-25 – 2022-04-27 (×5): 325 mg via ORAL
  Filled 2022-04-25 (×5): qty 1

## 2022-04-25 MED ORDER — OCUVITE-LUTEIN PO CAPS
1.0000 | ORAL_CAPSULE | Freq: Every day | ORAL | Status: DC
  Administered 2022-04-26 – 2022-04-27 (×2): 1 via ORAL
  Filled 2022-04-25 (×2): qty 1

## 2022-04-25 MED ORDER — ONDANSETRON HCL 4 MG/2ML IJ SOLN
4.0000 mg | Freq: Three times a day (TID) | INTRAMUSCULAR | Status: DC | PRN
  Administered 2022-04-26: 4 mg via INTRAVENOUS
  Filled 2022-04-25: qty 2

## 2022-04-25 MED ORDER — HYDRALAZINE HCL 20 MG/ML IJ SOLN
5.0000 mg | INTRAMUSCULAR | Status: DC | PRN
Start: 2022-04-25 — End: 2022-04-27

## 2022-04-25 MED ORDER — DOCUSATE SODIUM 100 MG PO CAPS
100.0000 mg | ORAL_CAPSULE | Freq: Two times a day (BID) | ORAL | Status: DC
  Administered 2022-04-25 – 2022-04-27 (×4): 100 mg via ORAL
  Filled 2022-04-25 (×4): qty 1

## 2022-04-25 MED ORDER — ALBUTEROL SULFATE (2.5 MG/3ML) 0.083% IN NEBU: 2.5000 mg | INHALATION_SOLUTION | RESPIRATORY_TRACT | Status: DC | PRN

## 2022-04-25 MED ORDER — OYSTER SHELL CALCIUM/D3 500-5 MG-MCG PO TABS
1.0000 | ORAL_TABLET | Freq: Every day | ORAL | Status: DC
  Administered 2022-04-25 – 2022-04-27 (×3): 1 via ORAL
  Filled 2022-04-25 (×3): qty 1

## 2022-04-25 MED ORDER — TETANUS-DIPHTH-ACELL PERTUSSIS 5-2.5-18.5 LF-MCG/0.5 IM SUSY
0.5000 mL | PREFILLED_SYRINGE | Freq: Once | INTRAMUSCULAR | Status: AC
  Administered 2022-04-25: 0.5 mL via INTRAMUSCULAR
  Filled 2022-04-25: qty 0.5

## 2022-04-25 MED ORDER — SODIUM CHLORIDE 0.9 % IV SOLN
1.0000 g | INTRAVENOUS | Status: DC
  Administered 2022-04-26 – 2022-04-27 (×2): 1 g via INTRAVENOUS
  Filled 2022-04-25 (×2): qty 1

## 2022-04-25 MED ORDER — LORAZEPAM 0.5 MG PO TABS
0.5000 mg | ORAL_TABLET | Freq: Three times a day (TID) | ORAL | Status: DC | PRN
Start: 2022-04-25 — End: 2022-04-27
  Administered 2022-04-25 – 2022-04-26 (×2): 0.5 mg via ORAL
  Filled 2022-04-25 (×2): qty 1

## 2022-04-25 MED ORDER — PANTOPRAZOLE SODIUM 40 MG PO TBEC
40.0000 mg | DELAYED_RELEASE_TABLET | Freq: Every day | ORAL | Status: DC
  Administered 2022-04-26 – 2022-04-27 (×2): 40 mg via ORAL
  Filled 2022-04-25 (×2): qty 1

## 2022-04-25 MED ORDER — ENOXAPARIN SODIUM 30 MG/0.3ML IJ SOSY
30.0000 mg | PREFILLED_SYRINGE | INTRAMUSCULAR | Status: DC
  Administered 2022-04-25 – 2022-04-26 (×2): 30 mg via SUBCUTANEOUS
  Filled 2022-04-25 (×2): qty 0.3

## 2022-04-25 MED ORDER — ASCORBIC ACID 500 MG PO TABS
500.0000 mg | ORAL_TABLET | Freq: Every day | ORAL | Status: DC
  Administered 2022-04-26 – 2022-04-27 (×2): 500 mg via ORAL
  Filled 2022-04-25 (×2): qty 1

## 2022-04-25 MED ORDER — METOPROLOL TARTRATE 25 MG PO TABS
25.0000 mg | ORAL_TABLET | Freq: Two times a day (BID) | ORAL | Status: DC
  Administered 2022-04-25 – 2022-04-27 (×4): 25 mg via ORAL
  Filled 2022-04-25 (×4): qty 1

## 2022-04-25 NOTE — H&P (Signed)
History and Physical    Lindsey Burke NGE:[id number] DOB: [birth year] DOA: 04/25/2022  Referring MD/NP/PA:   PCP: Jaclyn Shaggy, MD   Patient coming from:  The patient is coming from home.  At baseline, pt is independent for most of ADL.        Chief Complaint: AMS and increased urinary frequency  HPI: Lindsey Burke is a 88 y.o. female with medical history significant of hyperlipidemia, COPD, GERD, anxiety, renal stone, mitral valve prolapse, hepatitis, gallstone, dysphagia, iron deficiency anemia, who presents with altered mental status, urinary frequency and fall.  Per her daughter, patient has been intermittently confused for almost 2 weeks.  Patient has had multiple falls this week without significant injury, but has as small skin laceration in left elbow area.  Patient does not have unilateral numbness or tinglings in extremities.  No facial droop or slurred speech.  Denies chest pain, cough, shortness breath.  Patient has increased urinary frequency, no dysuria or burning on urination. When I saw pt in ED, she has very mild confusion, she is orientated x3.  She moves all extremities normally.  Data Reviewed and ED Course: pt was found to have WBC 7.9, positive urinalysis (cloudy appearance, moderate amount of leukocyte, many bacteria, WBC> 50), GFR> 60, mild abnormal liver function (ALP 72, AST 59, ALT 61, total bilirubin 0.7), temperature normal, blood pressure 118/70, heart rate 87, RR 20, oxygen saturation 98% on room air.  CT of CT head and C-spine negative for acute injury.  X-ray of left elbow negative.  Patient is placed on MedSurg bed for patient  CT of renal stone: 1.  Cholelithiasis without evidence of acute cholecystitis.   2. Multiple right renal calculi the largest measuring up to 4 mm without evidence of hydronephrosis or ureteral calculus.   3. Moderate amount of retained colonic stool suggesting constipation. Bowel loops are normal in caliber. No evidence  of colitis or diverticulitis.   4.  No CT evidence of acute abdominal/pelvic process.   EKG: I have personally reviewed.  Sinus rhythm, QTc 437, LAE, no ischemic change   Review of Systems:   General: no fevers, chills, no body weight gain, has fatigue HEENT: no blurry vision, hearing changes or sore throat Respiratory: no dyspnea, coughing, wheezing CV: no chest pain, no palpitations GI: no nausea, vomiting, abdominal pain, diarrhea, constipation GU: no dysuria, burning on urination, increased urinary frequency, hematuria  Ext: no leg edema Neuro: no unilateral weakness, numbness, or tingling, no vision change or hearing loss.  Has confusion and fall. Skin: no rash.  Has a skin laceration in left elbow MSK: No muscle spasm, no deformity, no limitation of range of movement in spin Heme: No easy bruising.  Travel history: No recent long distant travel.   Allergy:  Allergies  Allergen Reactions   Augmentin [Amoxicillin-Pot Clavulanate] Other (See Comments)    Elevated Liver Enzymes    Past Medical History:  Diagnosis Date   Back pain    Cholelithiasis    COPD (chronic obstructive pulmonary disease) (HCC)    Gallstones    GERD (gastroesophageal reflux disease)    Hammertoe    Hepatitis    Hyperlipidemia    Kidney stones 1970   Macular degeneration    MVP (mitral valve prolapse)    Nephrolithiasis    Ovarian cyst 2015   Ovarian cyst     Past Surgical History:  Procedure Laterality Date   AUGMENTATION MAMMAPLASTY Bilateral    bilateral implants   BREAST  SURGERY     implants   colonscopy  2012   Unremarkable-Dr Mechele Collin   ESOPHAGOGASTRODUODENOSCOPY (EGD) WITH PROPOFOL N/A 06/18/2015   Procedure: ESOPHAGOGASTRODUODENOSCOPY (EGD) WITH PROPOFOL;  Surgeon: Scot Jun, MD;  Location[facility] ENDOSCOPY;  Service: Endoscopy;  Laterality: N/A;   ESOPHAGOGASTRODUODENOSCOPY (EGD) WITH PROPOFOL N/A 10/16/2021   Procedure: ESOPHAGOGASTRODUODENOSCOPY (EGD) WITH PROPOFOL;   Surgeon: Toney Reil, MD;  Location: [facility] ENDOSCOPY;  Service: Gastroenterology;  Laterality: N/A;   SAVORY DILATION N/A 06/18/2015   Procedure: SAVORY DILATION;  Surgeon: Scot Jun, MD;  Location: [facility] ENDOSCOPY;  Service: Endoscopy;  Laterality: N/A;    Social History:  reports that she has never smoked. She has never used smokeless tobacco. She reports current alcohol use of about 1.0 standard drink of alcohol per week. She reports that she does not use drugs.  Family History:  Family History  Problem Relation Age of Onset   Cancer Mother    Cholelithiasis Father    Tuberculosis Father    Cancer Father        Prostate and Larynx   Cholelithiasis Sister    Breast cancer Maternal Aunt      Prior to Admission medications   Medication Sig Start Date End Date Taking? Authorizing Provider  alendronate (FOSAMAX) 70 MG tablet Take 1 tablet by mouth once a week. 04/07/15   [provider]  aspirin EC 81 MG tablet Take 81 mg by mouth daily.    [provider]  Calcium Carbonate (OS-CAL PO) Take 1 tablet by mouth daily.    [provider]  gabapentin (NEURONTIN) 300 MG capsule Take 1 capsule by mouth at bedtime. 02/23/15   [provider]  iron polysaccharides (NIFEREX) 150 MG capsule Take 1 capsule (150 mg total) by mouth daily. 10/17/21 01/15/22  Gillis Santa, MD  LORazepam (ATIVAN) 0.5 MG tablet Take 1 tablet by mouth every 8 (eight) hours as needed. 03/23/15   [provider]  metoprolol tartrate (LOPRESSOR) 25 MG tablet Take 1 tablet (25 mg total) by mouth 2 (two) times daily. 10/16/21 01/14/22  Gillis Santa, MD  Multiple Vitamins-Minerals (PRESERVISION AREDS PO) Take 1 tablet by mouth daily.    [provider]  Omega-3 Fatty Acids (FISH OIL) 1000 MG CAPS Take 1 capsule by mouth daily.    [provider]  omeprazole (PRILOSEC) 40 MG capsule Take 1 capsule (40 mg total) by mouth daily. 10/16/21 10/16/22  Gillis Santa,  MD    Physical Exam: Vitals:   04/25/22 1100 04/25/22 1130 04/25/22 1200 04/25/22 1230  BP: 125/72 114/68 127/62 122/68  Pulse: 84 83 84 87  Resp: 16 (!) 21 14 (!) 21  Temp:      TempSrc:      SpO2: 98% 97% 98% 97%  Weight:      Height:       General: Not in acute distress HEENT:       Eyes: PERRL, EOMI, no scleral icterus.       ENT: No discharge from the ears and nose, no pharynx injection, no tonsillar enlargement.        Neck: No JVD, no bruit, no mass felt. Heme: No neck lymph node enlargement. Cardiac: S1/S2, RRR, No gallops or rubs. Respiratory: No rales, wheezing, rhonchi or rubs. GI: Soft, nondistended, nontender, no rebound pain, no organomegaly, BS present. GU: No hematuria Ext: No pitting leg edema bilaterally. 1+DP/PT pulse bilaterally. Musculoskeletal: No joint deformities, No joint redness or warmth, no limitation of ROM in  spin. Skin: Has a small skin laceration in left elbow without active bleeding Neuro: Very mildly confused, still oriented X3, cranial nerves II-XII grossly intact, moves all extremities normally.  Psych: Patient is not psychotic, no suicidal or hemocidal ideation.  Labs on Admission: I have personally reviewed following labs and imaging studies  CBC: Recent Labs  Lab 04/25/22 0812  WBC 7.7  HGB 11.9*  HCT 37.5  MCV 94.7  PLT 205   Basic Metabolic Panel: Recent Labs  Lab 04/25/22 0812  NA 141  K 3.5  CL 106  CO2 26  GLUCOSE 115*  BUN 16  CREATININE 0.51  CALCIUM 9.0   GFR: Estimated Creatinine Clearance: 28.9 mL/min (by C-G formula based on SCr of 0.51 mg/dL). Liver Function Tests: Recent Labs  Lab 04/25/22 0812  AST 59*  ALT 61*  ALKPHOS 72  BILITOT 0.7  PROT 6.5  ALBUMIN 3.0*   No results for input(s): "LIPASE", "AMYLASE" in the last 168 hours. Recent Labs  Lab 04/25/22 0949  AMMONIA <10   Coagulation Profile: No results for input(s): "INR", "PROTIME" in the last 168 hours. Cardiac Enzymes: Recent Labs   Lab 04/25/22 0949  CKTOTAL 70   BNP (last 3 results) No results for input(s): "PROBNP" in the last 8760 hours. HbA1C: Recent Labs    04/25/22 0812  HGBA1C 5.6   CBG: No results for input(s): "GLUCAP" in the last 168 hours. Lipid Profile: No results for input(s): "CHOL", "HDL", "LDLCALC", "TRIG", "CHOLHDL", "LDLDIRECT" in the last 72 hours. Thyroid Function Tests: No results for input(s): "TSH", "T4TOTAL", "FREET4", "T3FREE", "THYROIDAB" in the last 72 hours. Anemia Panel: No results for input(s): "VITAMINB12", "FOLATE", "FERRITIN", "TIBC", "IRON", "RETICCTPCT" in the last 72 hours. Urine analysis:    Component Value Date/Time   COLORURINE YELLOW (A) 04/25/2022 0812   APPEARANCEUR CLOUDY (A) 04/25/2022 0812   LABSPEC 1.018 04/25/2022 0812   PHURINE 5.0 04/25/2022 0812   GLUCOSEU NEGATIVE 04/25/2022 0812   HGBUR MODERATE (A) 04/25/2022 0812   BILIRUBINUR NEGATIVE 04/25/2022 0812   KETONESUR 20 (A) 04/25/2022 0812   PROTEINUR 100 (A) 04/25/2022 0812   NITRITE NEGATIVE 04/25/2022 0812   LEUKOCYTESUR MODERATE (A) 04/25/2022 0812   Sepsis Labs: (procalcitonin:4,lacticidven:4) ) Recent Results (from the past 240 hour(s))  SARS Coronavirus 2 by RT PCR (hospital order, performed in [facility] hospital lab) *cepheid single result test* Anterior Nasal Swab     Status: None   Collection Time: 04/25/22 10:26 AM   Specimen: Anterior Nasal Swab  Result Value Ref Range Status   SARS Coronavirus 2 by RT PCR NEGATIVE NEGATIVE Final    Comment: (NOTE) SARS-CoV-2 target nucleic acids are NOT DETECTED.  The SARS-CoV-2 RNA is generally detectable in upper and lower respiratory specimens during the acute phase of infection. The lowest concentration of SARS-CoV-2 viral copies this assay can detect is 250 copies / mL. A negative result does not preclude SARS-CoV-2 infection and should not be used as the sole basis for treatment or other patient management decisions.  A  negative result may occur with improper specimen collection / handling, submission of specimen other than nasopharyngeal swab, presence of viral mutation(s) within the areas targeted by this assay, and inadequate number of viral copies (<250 copies / mL). A negative result must be combined with clinical observations, patient history, and epidemiological information.  Fact Sheet for Patients:   RoadLapTop.co.za  Fact Sheet for Healthcare Providers: http://kim-miller.com/  This test is not yet approved or  cleared by the [place] FDA and has been authorized for detection and/or diagnosis of SARS-CoV-2 by FDA under an Emergency Use Authorization (EUA).  This EUA will remain in effect (meaning this test can be used) for the duration of the COVID-19 declaration under Section 564(b)(1) of the Act, 21 U.S.C. section 360bbb-3(b)(1), unless the authorization is terminated or revoked sooner.  Performed at [facility], [address] [place], [place] [address]      Radiological Exams on Admission: CT Renal Stone Study  Result Date: 04/25/2022 CLINICAL DATA:  Flank pain, kidney stone suspected. EXAM: CT ABDOMEN AND PELVIS WITHOUT CONTRAST TECHNIQUE: Multidetector CT imaging of the abdomen and pelvis was performed following the standard protocol without IV contrast. RADIATION DOSE REDUCTION: This exam was performed according to the departmental dose-optimization program which includes automated exposure control, adjustment of the mA and/or kV according to patient size and/or use of iterative reconstruction technique. COMPARISON:  CT examination dated May 20, 2014 FINDINGS: Lower chest: No acute abnormality.  Mild bibasilar fibrotic changes. Hepatobiliary: No focal liver abnormality is seen. Multiple dependent gallstones without gallbladder wall thickening, or biliary dilatation. Pancreas: Unremarkable. No pancreatic ductal dilatation or  surrounding inflammatory changes. Spleen: Normal in size without focal abnormality. Adrenals/Urinary Tract: Adrenal glands are unremarkable. Multiple right renal calculi for example a 4 mm calculus in the midpole and two other midpole calculi measuring 3 and 2 mm. No hydronephrosis or ureteral calculus identified. Simple exophytic cyst in the lower pole of the right kidney, unchanged, no further imaging follow-up is recommended. Left kidney and ureter are unremarkable. Bladder is unremarkable. Stomach/Bowel: Stomach is within normal limits. Appendix not identified. Moderate amount of stool in the colon. No evidence of bowel wall thickening, distention, or inflammatory changes. Vascular/Lymphatic: Aortic atherosclerosis. No enlarged abdominal or pelvic lymph nodes. Reproductive: Uterus and bilateral adnexa are unremarkable. Other: No abdominal wall hernia or abnormality. No abdominopelvic ascites. Musculoskeletal: Hypodense lesion in the L3 vertebral body, unchanged from 2015, most consistent with a hemangioma. Degenerate disc disease of the lumbar spine. No acute fracture. IMPRESSION: 1.  Cholelithiasis without evidence of acute cholecystitis. 2. Multiple right renal calculi the largest measuring up to 4 mm without evidence of hydronephrosis or ureteral calculus. 3. Moderate amount of retained colonic stool suggesting constipation. Bowel loops are normal in caliber. No evidence of colitis or diverticulitis. 4.  No CT evidence of acute abdominal/pelvic process. Electronically Signed   By: Larose HiresImran  Ahmed D.O.   On: 04/25/2022 10:05   CT HEAD WO CONTRAST (5MM)  Result Date: 04/25/2022 CLINICAL DATA:  Neck trauma EXAM: CT HEAD WITHOUT CONTRAST CT CERVICAL SPINE WITHOUT CONTRAST TECHNIQUE: Multidetector CT imaging of the head and cervical spine was performed following the standard protocol without intravenous contrast. Multiplanar CT image reconstructions of the cervical spine were also generated. RADIATION DOSE  REDUCTION: This exam was performed according to the departmental dose-optimization program which includes automated exposure control, adjustment of the mA and/or kV according to patient size and/or use of iterative reconstruction technique. COMPARISON:  Head CT dated October 12, 2021 FINDINGS: CT HEAD FINDINGS Brain: No evidence of acute infarction, hemorrhage, hydrocephalus, extra-axial collection or mass lesion/mass effect. Vascular: No hyperdense vessel or unexpected calcification. Skull: Normal. Negative for fracture or focal lesion. Sinuses/Orbits: No acute finding. Other: None. CT CERVICAL SPINE FINDINGS Alignment: Normal. Skull base and vertebrae: No acute fracture. No primary bone lesion or focal pathologic process. Soft tissues and spinal canal: No prevertebral fluid or swelling. No visible canal hematoma. Disc levels:  Mild multilevel  degenerative disc disease. Upper chest: Biapical pleural-parenchymal scarring. Other: None IMPRESSION: 1. No acute intracranial abnormality. 2. No evidence of acute cervical spine fracture or traumatic malalignment. Electronically Signed   By: Allegra Lai M.D.   On: 04/25/2022 09:57   CT Cervical Spine Wo Contrast  Result Date: 04/25/2022 CLINICAL DATA:  Neck trauma EXAM: CT HEAD WITHOUT CONTRAST CT CERVICAL SPINE WITHOUT CONTRAST TECHNIQUE: Multidetector CT imaging of the head and cervical spine was performed following the standard protocol without intravenous contrast. Multiplanar CT image reconstructions of the cervical spine were also generated. RADIATION DOSE REDUCTION: This exam was performed according to the departmental dose-optimization program which includes automated exposure control, adjustment of the mA and/or kV according to patient size and/or use of iterative reconstruction technique. COMPARISON:  Head CT dated October 12, 2021 FINDINGS: CT HEAD FINDINGS Brain: No evidence of acute infarction, hemorrhage, hydrocephalus, extra-axial collection or mass  lesion/mass effect. Vascular: No hyperdense vessel or unexpected calcification. Skull: Normal. Negative for fracture or focal lesion. Sinuses/Orbits: No acute finding. Other: None. CT CERVICAL SPINE FINDINGS Alignment: Normal. Skull base and vertebrae: No acute fracture. No primary bone lesion or focal pathologic process. Soft tissues and spinal canal: No prevertebral fluid or swelling. No visible canal hematoma. Disc levels:  Mild multilevel degenerative disc disease. Upper chest: Biapical pleural-parenchymal scarring. Other: None IMPRESSION: 1. No acute intracranial abnormality. 2. No evidence of acute cervical spine fracture or traumatic malalignment. Electronically Signed   By: Allegra Lai M.D.   On: 04/25/2022 09:57   DG Elbow Complete Left  Result Date: 04/25/2022 CLINICAL DATA:  Fall. EXAM: LEFT ELBOW - COMPLETE 3+ VIEW COMPARISON:  None Available. FINDINGS: There is no evidence of fracture, dislocation, or joint effusion. There is no evidence of arthropathy or other focal bone abnormality. Soft tissues are unremarkable. IMPRESSION: Negative. Electronically Signed   By: Lupita Raider M.D.   On: 04/25/2022 09:50      Assessment/Plan Principal Problem:   UTI (urinary tract infection) Active Problems:   Acute metabolic encephalopathy   Fall at home, initial encounter   COPD (chronic obstructive pulmonary disease) (HCC)   Elevated troponin   Abnormal LFTs   Iron deficiency anemia   Principal Problem:   UTI (urinary tract infection) Active Problems:   Acute metabolic encephalopathy   Fall at home, initial encounter   COPD (chronic obstructive pulmonary disease) (HCC)   Elevated troponin   Abnormal LFTs   Iron deficiency anemia   Assessment and Plan: * UTI (urinary tract infection) Patient does not have sepsis today.  Hemodynamically stable -Placed on MedSurg bed for observation -IV Rocephin -Follow-up urine culture  Acute metabolic encephalopathy Patient has very  minimal confusion which is likely due to UTI.  CT head negative. -Frequent neurochecks  Fall at home, initial encounter - PT/OT -Fall precaution  COPD (chronic obstructive pulmonary disease) (HCC) Stable -Bronchodilators  Elevated troponin Troponin level 19 -->19, no chest pain, likely demand ischemia or nonspecific trend. -Aspirin -Check A1c, repeat  Abnormal LFTs Mild abnormal liver function, AST 59, ALT 51, total bilirubin 0.7, ALP 72, no abdominal pain.  Possibly due to ongoing infection -Avoid using Tylenol -Follow-up with CMP  Iron deficiency anemia Hemoglobin stable, 11.9 -Continue iron supplement             DVT ppx: SQ Lovenox  Code Status: Full code  Family Communication:  Yes, patient's daughter   at bed side.   Disposition Plan:  Anticipate discharge back to previous environment  Consults  called:  none  Admission status and Level of care: Med-Surg:    for obs     Severity of Illness:  The appropriate patient status for this patient is OBSERVATION. Observation status is judged to be reasonable and necessary in order to provide the required intensity of service to ensure the patient's safety. The patient's presenting symptoms, physical exam findings, and initial radiographic and laboratory data in the context of their medical condition is felt to place them at decreased risk for further clinical deterioration. Furthermore, it is anticipated that the patient will be medically stable for discharge from the hospital within 2 midnights of admission.        Date of Service 04/25/2022    Lorretta Harp [facility]   If 7PM-7AM, please contact night-coverage [email or web address] 04/25/2022, 6:48 PM

## 2022-04-25 NOTE — Progress Notes (Signed)
Admission profile updated. ?

## 2022-04-25 NOTE — ED Notes (Signed)
Patient transported to CT 

## 2022-04-25 NOTE — ED Provider Notes (Addendum)
[facility] Provider Note    Event Date/Time   First MD Initiated Contact with Patient 04/25/22 [phone]     (approximate)   History   Fall and Altered Mental Status   HPI  Lindsey Burke is a 88 y.o. female with COPD who comes in with concerns for altered mental status and UTI.  Patient reports not feeling right for the past 2 to 3 weeks.  She reports that she has had some increased urination.  She reports that over the past few days she is had recurrent falls where she just gets weak.  She does not think she hit her head.  However she does have a skin tear on her left arm.   Physical Exam   Triage Vital Signs: ED Triage Vitals  Enc Vitals Group     BP 04/25/22 0809 121/64     Pulse Rate 04/25/22 0809 87     Resp 04/25/22 0809 16     Temp 04/25/22 0809 98.4 F (36.9 C)     Temp Source 04/25/22 0809 Oral     SpO2 04/25/22 0809 98 %     Weight 04/25/22 0810 80 lb 0.4 oz (36.3 kg)     Height 04/25/22 0810 5' (1.524 m)     Head Circumference --      Peak Flow --      Pain Score 04/25/22 0809 10     Pain Loc --      Pain Edu? --      Excl. in GC? --     Most recent vital signs: Vitals:   04/25/22 0809  BP: 121/64  Pulse: 87  Resp: 16  Temp: 98.4 F (36.9 C)  SpO2: 98%     General: Awake, no distress.  Alert and oriented x3 CV:  Good peripheral perfusion.  Resp:  Normal effort.  Abd:  No distention.  Other:  Skin tear noted to the left elbow area.  No obvious C-spine tenderness.  No chest wall tenderness, no abdominal tenderness.  Able to lift both legs up off the bed.   ED Results / Procedures / Treatments   Labs (all labs ordered are listed, but only abnormal results are displayed) Labs Reviewed  COMPREHENSIVE METABOLIC PANEL - Abnormal; Notable for the following components:      Result Value   Glucose, Bld 115 (*)    Albumin 3.0 (*)    AST 59 (*)    ALT 61 (*)    All other components within normal limits  CBC - Abnormal;  Notable for the following components:   Hemoglobin 11.9 (*)    All other components within normal limits  URINALYSIS, COMPLETE (UACMP) WITH MICROSCOPIC - Abnormal; Notable for the following components:   Color, Urine YELLOW (*)    APPearance CLOUDY (*)    Hgb urine dipstick MODERATE (*)    Ketones, ur 20 (*)    Protein, ur 100 (*)    Leukocytes,Ua MODERATE (*)    RBC / HPF >50 (*)    WBC, UA >50 (*)    Bacteria, UA MANY (*)    Non Squamous Epithelial PRESENT (*)    All other components within normal limits  CBG MONITORING, ED     EKG  My interpretation of EKG:  Normal sinus rate of 84 without any ST elevation, T wave version in aVL, normal intervals  RADIOLOGY I have reviewed the xray personally and interpreted and do not see any evidence of fracture  CT head, neck negative.  CT abdomen shows cholelithiasis and some kidney stones but no signs of obstructing kidney stone  PROCEDURES:  Critical Care performed: No  .1-3 Lead EKG Interpretation  Performed by: Concha Se, MD Authorized by: Concha Se, MD     Interpretation: normal     ECG rate:  80   ECG rate assessment: normal     Rhythm: sinus rhythm     Ectopy: none     Conduction: normal      MEDICATIONS ORDERED IN ED: Medications  Tdap (BOOSTRIX) injection 0.5 mL (0.5 mLs Intramuscular Given 04/25/22 0950)     IMPRESSION / MDM / ASSESSMENT AND PLAN / ED COURSE  I reviewed the triage vital signs and the nursing notes.   Patient's presentation is most consistent with acute presentation with potential threat to life or bodily function.   Differential includes UTI, intracranial hemorrhage, cervical fracture, kidney stone  CMP has slight bump in her LFTs have added on a CK just to ensure there is no signs of rhabdo given all the recurrent falls.  Her CBC shows no anemia her urine has significant amount of RBCs and WBCs will add on urine culture and give a dose of ceftriaxone given otherwise she does not  meet sepsis criteria.  Given the significant mount of RBCs have also done a CT renal just to ensure there is not an obstructing kidney stone that is causing her to be more altered.  Pt denies any tylenol use and denies any RUQ pain to suggest cholecysittis   Given patient's recurrent falls and failing outpatient treatment of UTI will discuss with the hospital team for admission   The patient is on the cardiac monitor to evaluate for evidence of arrhythmia and/or significant heart rate changes.      FINAL CLINICAL IMPRESSION(S) / ED DIAGNOSES   Final diagnoses:  Lower urinary tract infectious disease  Fall, initial encounter  Confusion     Rx / DC Orders   ED Discharge Orders     None        Note:  This document was prepared using Dragon voice recognition software and may include unintentional dictation errors.   Concha Se, MD 04/25/22 1020    Concha Se, MD 04/25/22 1025

## 2022-04-25 NOTE — Assessment & Plan Note (Addendum)
Mild abnormal liver function, AST 59, ALT 51, total bilirubin 0.7, ALP 72, no abdominal pain.  Possibly due to ongoing infection -Avoid using Tylenol -Follow-up with CMP

## 2022-04-25 NOTE — Assessment & Plan Note (Signed)
Stable -Bronchodilators 

## 2022-04-25 NOTE — Assessment & Plan Note (Addendum)
-   PT/OT -Fall precaution

## 2022-04-25 NOTE — Evaluation (Signed)
Physical Therapy Evaluation Patient Details Name: Lindsey Burke MRN: [id number] DOB: September 13, 1934 Today's Date: 04/25/2022  History of Present Illness  Lindsey Burke is a 88 y.o. female with COPD who comes in with concerns for altered mental status and UTI.  Patient reports not feeling right for the past 2 to 3 weeks.  She reports that she has had some increased urination.  She reports that over the past few days she is had recurrent falls where she just gets weak.  She does not think she hit her head.  However she does have a skin tear on her left arm.    Clinical Impression  Pt received in supine position and agreeable to therapy.  Pt currently lives alone, but anticipate living with her daughter and family (which reside in the house beside her).  Pt will have assistance at home with all tasks necessary.  Pt is able to mobilize fairly well, but does have some weakness noted overall in LE's, and would benefit from HHPT, however pt and daughter decline services at this time.  Pt tends to shuffle her feet and scuffs her shoes on the floor which causes her to almost stumble and experience uncontrolled descent.  Pt would benefit from continued therapy in order to address residual weakness and gait abnormalities that are present.  Current discharge plans to HHPT are appropriate, however understanding that the pt and daughter decline this time.  Pt will continue to benefit from skilled therapy in order to address deficits listed below.         Recommendations for follow up therapy are one component of a multi-disciplinary discharge planning process, led by the attending physician.  Recommendations may be updated based on patient status, additional functional criteria and insurance authorization.  Follow Up Recommendations Home health PT (Pt and daughter denied need for HHPT and would consult later if needed.)    Assistance Recommended at Discharge PRN  Patient can return home with the  following  A little help with walking and/or transfers;A little help with bathing/dressing/bathroom    Equipment Recommendations None recommended by PT  Recommendations for Other Services       Functional Status Assessment Patient has had a recent decline in their functional status and demonstrates the ability to make significant improvements in function in a reasonable and predictable amount of time.     Precautions / Restrictions Precautions Precautions: Fall Restrictions Weight Bearing Restrictions: No      Mobility  Bed Mobility Overal bed mobility: Needs Assistance Bed Mobility: Supine to Sit     Supine to sit: HOB elevated     General bed mobility comments: Use of handrails for support.    Transfers Overall transfer level: Needs assistance Equipment used: Rolling walker (2 wheels) Transfers: Sit to/from Stand Sit to Stand: Min guard           General transfer comment: pt slightly unsteady upon standing from seat position    Ambulation/Gait Ambulation/Gait assistance: Min guard Gait Distance (Feet): 200 Feet Assistive device: Rolling walker (2 wheels) Gait Pattern/deviations: Step-through pattern, Decreased stride length, Shuffle Gait velocity: decreased     General Gait Details: pt scuffing feet during gait at times that causes her to stumble.  Stairs            Wheelchair Mobility    Modified Rankin (Stroke Patients Only)       Balance Overall balance assessment: Needs assistance Sitting-balance support: No upper extremity supported, Feet supported Sitting balance-Leahy Scale: Good  Standing balance support: No upper extremity supported, During functional activity Standing balance-Leahy Scale: Good                               Pertinent Vitals/Pain Pain Assessment Pain Assessment: No/denies pain    Home Living Family/patient expects to be discharged to:: Private residence Living Arrangements: Alone Available  Help at Discharge: Family;Available 24 hours/day Type of Home: House Home Access: Stairs to enter Entrance Stairs-Rails: Lawyer of Steps: 3-4 steps   Home Layout: One level;Laundry or work area in [employer] Equipment: Agricultural consultant (2 wheels);Cane - single point Additional Comments: anticipate going to daughter's/neighbor's house after hospital stay.    Prior Function Prior Level of Function : History of Falls (last six months)             Mobility Comments: Pt drives, gets her meds, makes meals; 3 falls, Friday Night, Monday Morning, and Last Night ADLs Comments: independent     Hand Dominance   Dominant Hand: Right    Extremity/Trunk Assessment   Upper Extremity Assessment Upper Extremity Assessment: Generalized weakness    Lower Extremity Assessment Lower Extremity Assessment: Generalized weakness       Communication   Communication: No difficulties  Cognition Arousal/Alertness: Awake/alert Behavior During Therapy: WFL for tasks assessed/performed Overall Cognitive Status: Within Functional Limits for tasks assessed                                          General Comments      Exercises     Assessment/Plan    PT Assessment Patient needs continued PT services  PT Problem List Decreased strength;Decreased activity tolerance;Decreased balance       PT Treatment Interventions DME instruction;Gait training;Functional mobility training;Therapeutic activities;Therapeutic exercise    PT Goals (Current goals can be found in the Care Plan section)  Acute Rehab PT Goals Patient Stated Goal: to go home. PT Goal Formulation: With patient Time For Goal Achievement: 05/09/22 Potential to Achieve Goals: Good    Frequency Min 2X/week     Co-evaluation               AM-PAC PT "6 Clicks" Mobility  Outcome Measure Help needed turning from your back to your side while in a flat bed without using bedrails?:  A Little Help needed moving from lying on your back to sitting on the side of a flat bed without using bedrails?: A Little Help needed moving to and from a bed to a chair (including a wheelchair)?: A Little Help needed standing up from a chair using your arms (e.g., wheelchair or bedside chair)?: A Little Help needed to walk in hospital room?: A Little Help needed climbing 3-5 steps with a railing? : A Little 6 Click Score: 18    End of Session Equipment Utilized During Treatment: Gait belt Activity Tolerance: Patient tolerated treatment well Patient left: in bed;with call bell/phone within reach;with family/visitor present Nurse Communication: Mobility status PT Visit Diagnosis: Unsteadiness on feet (R26.81);Other abnormalities of gait and mobility (R26.89);Repeated falls (R29.6);Muscle weakness (generalized) (M62.81);History of falling (Z91.81)    Time: 8338-2505 PT Time Calculation (min) (ACUTE ONLY): 34 min   Charges:   PT Evaluation $PT Eval Low Complexity: 1 Low PT Treatments $Gait Training: 8-22 mins        Nolon Bussing, PT, DPT  04/25/22, 3:58 PM   Phineas Real 04/25/2022, 3:49 PM

## 2022-04-25 NOTE — Consult Note (Signed)
WOC Nurse Consult Note: Patient receiving care in [facility] ED38. Reason for Consult: skin tear on left elbow after fall. Wound type: trauma injury skin tear Pressure Injury POA: Yes/No/NA Measurement: Wound bed: Drainage (amount, consistency, odor)  Periwound: Dressing procedure/placement/frequency: Cleanse the skin tear on the left elbow with normal saline. Pat dry. Place a layer of Xeroform over the skin tear area, then an ABD pad. Secure with a few turns of kerlix.  Please note:  The Skin Care Standing Order Set is available to all licensed nurses in the [place] [facility] system for topical care of skin tears, Stage 1 & 2 pressure injuries and Moisture Associated Skin Damage without needing a WOC consult.  Please feel free to implement any needed treatments pertaining to these conditions within the order set.  Monitor the wound area(s) for worsening of condition such as: Signs/symptoms of infection,  Increase in size,  Development of or worsening of odor, Development of pain, or increased pain at the affected locations.  Notify the medical team if any of these develop.  WOC nurse will not follow at this time.  Please re-consult the WOC team if needed.  Helmut Muster, RN, MSN, CWOCN, CNS-BC, pager [phone]

## 2022-04-25 NOTE — ED Triage Notes (Signed)
Pt states she is here for falls and feeling abnormal. Daughter states pt has a UTI after going to the doctor yesterday. Pt states she did not hit her head with any of the falls. Pt c/o left arm pain and generalized body pain.

## 2022-04-25 NOTE — ED Notes (Signed)
Pt ate 25% of meal tray.  °

## 2022-04-25 NOTE — Assessment & Plan Note (Signed)
Troponin level 19 -->19, no chest pain, likely demand ischemia or nonspecific trend. -Aspirin -Check A1c, repeat

## 2022-04-25 NOTE — Assessment & Plan Note (Signed)
Patient has very minimal confusion which is likely due to UTI.  CT head negative. -Frequent neurochecks

## 2022-04-25 NOTE — Assessment & Plan Note (Signed)
Hemoglobin stable, 11.9 -Continue iron supplement

## 2022-04-25 NOTE — Assessment & Plan Note (Signed)
Patient does not have sepsis today.  Hemodynamically stable -Placed on MedSurg bed for observation -IV Rocephin -Follow-up urine culture

## 2022-04-26 ENCOUNTER — Encounter: Payer: Self-pay | Admitting: Internal Medicine

## 2022-04-26 DIAGNOSIS — N390 Urinary tract infection, site not specified: Secondary | ICD-10-CM | POA: Diagnosis not present

## 2022-04-26 DIAGNOSIS — J449 Chronic obstructive pulmonary disease, unspecified: Secondary | ICD-10-CM | POA: Diagnosis not present

## 2022-04-26 DIAGNOSIS — R7989 Other specified abnormal findings of blood chemistry: Secondary | ICD-10-CM | POA: Diagnosis not present

## 2022-04-26 DIAGNOSIS — N3000 Acute cystitis without hematuria: Secondary | ICD-10-CM | POA: Diagnosis not present

## 2022-04-26 DIAGNOSIS — G9341 Metabolic encephalopathy: Secondary | ICD-10-CM | POA: Diagnosis not present

## 2022-04-26 LAB — LIPID PANEL
Cholesterol: 126 mg/dL (ref 0–200)
HDL: 35 mg/dL — ABNORMAL LOW (ref >40)
LDL Cholesterol: 74 mg/dL (ref 0–99)
Total CHOL/HDL Ratio: 3.6 RATIO
Triglycerides: 87 mg/dL (ref <150)
VLDL: 17 mg/dL (ref 0–40)

## 2022-04-26 LAB — COMPREHENSIVE METABOLIC PANEL
ALT: 45 U/L — ABNORMAL HIGH (ref 0–44)
AST: 37 U/L (ref 15–41)
Albumin: 2.5 g/dL — ABNORMAL LOW (ref 3.5–5.0)
Alkaline Phosphatase: 67 U/L (ref 38–126)
Anion gap: 5 (ref 5–15)
BUN: 14 mg/dL (ref 8–23)
CO2: 29 mmol/L (ref 22–32)
Calcium: 8.6 mg/dL — ABNORMAL LOW (ref 8.9–10.3)
Chloride: 110 mmol/L (ref 98–111)
Creatinine, Ser: 0.61 mg/dL (ref 0.44–1.00)
GFR, Estimated: >60 mL/min (ref >60)
Glucose, Bld: 98 mg/dL (ref 70–99)
Potassium: 3.7 mmol/L (ref 3.5–5.1)
Sodium: 144 mmol/L (ref 135–145)
Total Bilirubin: 0.7 mg/dL (ref 0.3–1.2)
Total Protein: 5.6 g/dL — ABNORMAL LOW (ref 6.5–8.1)

## 2022-04-26 NOTE — Progress Notes (Signed)
PROGRESS NOTE    Lindsey Burke  [id number] DOB: Oct 15, 1934 DOA: 04/25/2022 PCP: Albina Billet, MD    Brief Narrative:  Lindsey Burke is a 88 y.o. female with medical history significant of hyperlipidemia, COPD, GERD, anxiety, renal stone, mitral valve prolapse, hepatitis, gallstone, dysphagia, iron deficiency anemia, who presents with altered mental status, urinary frequency and fall.   Per her daughter, patient has been intermittently confused for almost 2 weeks.  Patient has had multiple falls this week without significant injury, but has as small skin laceration in left elbow area.  Patient does not have unilateral numbness or tinglings in extremities.  No facial droop or slurred speech.   Data Reviewed and ED Course: pt was found to have WBC 7.9, positive urinalysis (cloudy appearance, moderate amount of leukocyte, many bacteria, WBC> 50), GFR> 60, mild abnormal liver function (ALP 72, AST 59, ALT 61, total bilirubin 0.7), temperature normal, blood pressure 118/70, heart rate 87, RR 20, oxygen saturation 98% on room air.  CT of CT head and C-spine negative for acute injury.  X-ray of left elbow negative   6/21 MS at baseline. Family at bedside.      Consultants:    Procedures:  CT of renal stone: 1.  Cholelithiasis without evidence of acute cholecystitis.  2. Multiple right renal calculi the largest measuring up to 4 mm without evidence of hydronephrosis or ureteral calculus.  3. Moderate amount of retained colonic stool suggesting constipation. Bowel loops are normal in caliber. No evidence of colitis or diverticulitis. 4.  No CT evidence of acute abdominal/pelvic process.      Antimicrobials:      Subjective: No sob, or cp.  Objective: Vitals:   04/25/22 1848 04/25/22 2004 04/26/22 0352 04/26/22 0816  BP: 137/70 119/64 126/64 126/66  Pulse: 98 98 88 86  Resp: 17 16 16 17   Temp: 98.8 F (37.1 C) 98.2 F (36.8 C) 98.5 F (36.9 C) 98.5 F (36.9 C)   TempSrc:      SpO2: 99% 97% 100% 100%  Weight:      Height:        Intake/Output Summary (Last 24 hours) at 04/26/2022 0837 Last data filed at 04/25/2022 1131 Gross per 24 hour  Intake 100 ml  Output --  Net 100 ml   Filed Weights   04/25/22 0810  Weight: 36.3 kg    Examination: Calm, NAD Cta no w/r Reg s1/s2 no gallop Soft benign +bs No edema Aaoxox3  Mood and affect appropriate in current setting     Data Reviewed: I have personally reviewed following labs and imaging studies  CBC: Recent Labs  Lab 04/25/22 0812  WBC 7.7  HGB 11.9*  HCT 37.5  MCV 94.7  PLT 99991111   Basic Metabolic Panel: Recent Labs  Lab 04/25/22 0812 04/26/22 0439  NA 141 144  K 3.5 3.7  CL 106 110  CO2 26 29  GLUCOSE 115* 98  BUN 16 14  CREATININE 0.51 0.61  CALCIUM 9.0 8.6*   GFR: Estimated Creatinine Clearance: 28.9 mL/min (by C-G formula based on SCr of 0.61 mg/dL). Liver Function Tests: Recent Labs  Lab 04/25/22 0812 04/26/22 0439  AST 59* 37  ALT 61* 45*  ALKPHOS 72 67  BILITOT 0.7 0.7  PROT 6.5 5.6*  ALBUMIN 3.0* 2.5*   No results for input(s): "LIPASE", "AMYLASE" in the last 168 hours. Recent Labs  Lab 04/25/22 0949  AMMONIA <10   Coagulation Profile: No results for input(s): "INR", "  PROTIME" in the last 168 hours. Cardiac Enzymes: Recent Labs  Lab 04/25/22 0949  CKTOTAL 70   BNP (last 3 results) No results for input(s): "PROBNP" in the last 8760 hours. HbA1C: Recent Labs    04/25/22 0812  HGBA1C 5.6   CBG: No results for input(s): "GLUCAP" in the last 168 hours. Lipid Profile: Recent Labs    04/26/22 0439  CHOL 126  HDL 35*  LDLCALC 74  TRIG 87  CHOLHDL 3.6   Thyroid Function Tests: No results for input(s): "TSH", "T4TOTAL", "FREET4", "T3FREE", "THYROIDAB" in the last 72 hours. Anemia Panel: No results for input(s): "VITAMINB12", "FOLATE", "FERRITIN", "TIBC", "IRON", "RETICCTPCT" in the last 72 hours. Sepsis Labs: No results for  input(s): "PROCALCITON", "LATICACIDVEN" in the last 168 hours.  Recent Results (from the past 240 hour(s))  SARS Coronavirus 2 by RT PCR (hospital order, performed in [facility] hospital lab) *cepheid single result test* Anterior Nasal Swab     Status: None   Collection Time: 04/25/22 10:26 AM   Specimen: Anterior Nasal Swab  Result Value Ref Range Status   SARS Coronavirus 2 by RT PCR NEGATIVE NEGATIVE Final    Comment: (NOTE) SARS-CoV-2 target nucleic acids are NOT DETECTED.  The SARS-CoV-2 RNA is generally detectable in upper and lower respiratory specimens during the acute phase of infection. The lowest concentration of SARS-CoV-2 viral copies this assay can detect is 250 copies / mL. A negative result does not preclude SARS-CoV-2 infection and should not be used as the sole basis for treatment or other patient management decisions.  A negative result may occur with improper specimen collection / handling, submission of specimen other than nasopharyngeal swab, presence of viral mutation(s) within the areas targeted by this assay, and inadequate number of viral copies (<250 copies / mL). A negative result must be combined with clinical observations, patient history, and epidemiological information.  Fact Sheet for Patients:   RoadLapTop.co.za  Fact Sheet for Healthcare Providers: http://kim-miller.com/  This test is not yet approved or  cleared by the [place] FDA and has been authorized for detection and/or diagnosis of SARS-CoV-2 by FDA under an Emergency Use Authorization (EUA).  This EUA will remain in effect (meaning this test can be used) for the duration of the COVID-19 declaration under Section 564(b)(1) of the Act, 21 U.S.C. section 360bbb-3(b)(1), unless the authorization is terminated or revoked sooner.  Performed at [facility], [address] [place], [place] [address]          Radiology  Studies: CT Renal Stone Study  Result Date: 04/25/2022 CLINICAL DATA:  Flank pain, kidney stone suspected. EXAM: CT ABDOMEN AND PELVIS WITHOUT CONTRAST TECHNIQUE: Multidetector CT imaging of the abdomen and pelvis was performed following the standard protocol without IV contrast. RADIATION DOSE REDUCTION: This exam was performed according to the departmental dose-optimization program which includes automated exposure control, adjustment of the mA and/or kV according to patient size and/or use of iterative reconstruction technique. COMPARISON:  CT examination dated May 20, 2014 FINDINGS: Lower chest: No acute abnormality.  Mild bibasilar fibrotic changes. Hepatobiliary: No focal liver abnormality is seen. Multiple dependent gallstones without gallbladder wall thickening, or biliary dilatation. Pancreas: Unremarkable. No pancreatic ductal dilatation or surrounding inflammatory changes. Spleen: Normal in size without focal abnormality. Adrenals/Urinary Tract: Adrenal glands are unremarkable. Multiple right renal calculi for example a 4 mm calculus in the midpole and two other midpole calculi measuring 3 and 2 mm. No hydronephrosis or ureteral calculus identified. Simple exophytic cyst in  the lower pole of the right kidney, unchanged, no further imaging follow-up is recommended. Left kidney and ureter are unremarkable. Bladder is unremarkable. Stomach/Bowel: Stomach is within normal limits. Appendix not identified. Moderate amount of stool in the colon. No evidence of bowel wall thickening, distention, or inflammatory changes. Vascular/Lymphatic: Aortic atherosclerosis. No enlarged abdominal or pelvic lymph nodes. Reproductive: Uterus and bilateral adnexa are unremarkable. Other: No abdominal wall hernia or abnormality. No abdominopelvic ascites. Musculoskeletal: Hypodense lesion in the L3 vertebral body, unchanged from 2015, most consistent with a hemangioma. Degenerate disc disease of the lumbar spine. No acute  fracture. IMPRESSION: 1.  Cholelithiasis without evidence of acute cholecystitis. 2. Multiple right renal calculi the largest measuring up to 4 mm without evidence of hydronephrosis or ureteral calculus. 3. Moderate amount of retained colonic stool suggesting constipation. Bowel loops are normal in caliber. No evidence of colitis or diverticulitis. 4.  No CT evidence of acute abdominal/pelvic process. Electronically Signed   By: Larose Hires D.O.   On: 04/25/2022 10:05   CT HEAD WO CONTRAST ( )  Result Date: 04/25/2022 CLINICAL DATA:  Neck trauma EXAM: CT HEAD WITHOUT CONTRAST CT CERVICAL SPINE WITHOUT CONTRAST TECHNIQUE: Multidetector CT imaging of the head and cervical spine was performed following the standard protocol without intravenous contrast. Multiplanar CT image reconstructions of the cervical spine were also generated. RADIATION DOSE REDUCTION: This exam was performed according to the departmental dose-optimization program which includes automated exposure control, adjustment of the mA and/or kV according to patient size and/or use of iterative reconstruction technique. COMPARISON:  Head CT dated October 12, 2021 FINDINGS: CT HEAD FINDINGS Brain: No evidence of acute infarction, hemorrhage, hydrocephalus, extra-axial collection or mass lesion/mass effect. Vascular: No hyperdense vessel or unexpected calcification. Skull: Normal. Negative for fracture or focal lesion. Sinuses/Orbits: No acute finding. Other: None. CT CERVICAL SPINE FINDINGS Alignment: Normal. Skull base and vertebrae: No acute fracture. No primary bone lesion or focal pathologic process. Soft tissues and spinal canal: No prevertebral fluid or swelling. No visible canal hematoma. Disc levels:  Mild multilevel degenerative disc disease. Upper chest: Biapical pleural-parenchymal scarring. Other: None IMPRESSION: 1. No acute intracranial abnormality. 2. No evidence of acute cervical spine fracture or traumatic malalignment.  Electronically Signed   By: Allegra Lai M.D.   On: 04/25/2022 09:57   CT Cervical Spine Wo Contrast  Result Date: 04/25/2022 CLINICAL DATA:  Neck trauma EXAM: CT HEAD WITHOUT CONTRAST CT CERVICAL SPINE WITHOUT CONTRAST TECHNIQUE: Multidetector CT imaging of the head and cervical spine was performed following the standard protocol without intravenous contrast. Multiplanar CT image reconstructions of the cervical spine were also generated. RADIATION DOSE REDUCTION: This exam was performed according to the departmental dose-optimization program which includes automated exposure control, adjustment of the mA and/or kV according to patient size and/or use of iterative reconstruction technique. COMPARISON:  Head CT dated October 12, 2021 FINDINGS: CT HEAD FINDINGS Brain: No evidence of acute infarction, hemorrhage, hydrocephalus, extra-axial collection or mass lesion/mass effect. Vascular: No hyperdense vessel or unexpected calcification. Skull: Normal. Negative for fracture or focal lesion. Sinuses/Orbits: No acute finding. Other: None. CT CERVICAL SPINE FINDINGS Alignment: Normal. Skull base and vertebrae: No acute fracture. No primary bone lesion or focal pathologic process. Soft tissues and spinal canal: No prevertebral fluid or swelling. No visible canal hematoma. Disc levels:  Mild multilevel degenerative disc disease. Upper chest: Biapical pleural-parenchymal scarring. Other: None IMPRESSION: 1. No acute intracranial abnormality. 2. No evidence of acute cervical spine fracture or traumatic malalignment. Electronically Signed  By: Yetta Glassman M.D.   On: 04/25/2022 09:57   DG Elbow Complete Left  Result Date: 04/25/2022 CLINICAL DATA:  Fall. EXAM: LEFT ELBOW - COMPLETE 3+ VIEW COMPARISON:  None Available. FINDINGS: There is no evidence of fracture, dislocation, or joint effusion. There is no evidence of arthropathy or other focal bone abnormality. Soft tissues are unremarkable. IMPRESSION:  Negative. Electronically Signed   By: Marijo Conception M.D.   On: 04/25/2022 09:50        Scheduled Meds:  vitamin C  500 mg Oral Daily   aspirin EC  81 mg Oral Daily   calcium-vitamin D  1 tablet Oral Daily   docusate sodium  100 mg Oral BID   enoxaparin (LOVENOX) injection  30 mg Subcutaneous Q24H   ferrous sulfate  325 mg Oral TID WC   gabapentin  300 mg Oral QHS   metoprolol tartrate  25 mg Oral BID   multivitamin-lutein  1 capsule Oral Daily   pantoprazole  40 mg Oral Daily   Continuous Infusions:  cefTRIAXone (ROCEPHIN)  IV      Assessment & Plan:   Principal Problem:   UTI (urinary tract infection) Active Problems:   Acute metabolic encephalopathy   Fall at home, initial encounter   COPD (chronic obstructive pulmonary disease) (HCC)   Elevated troponin   Abnormal LFTs   Iron deficiency anemia   UTI (urinary tract infection) Patient does not have sepsis today.  Hemodynamically stable 6/21 continue Rocephin Ucx pending, will f/u   Acute metabolic encephalopathy CT head neg. Improved now, at baseline Likely 2/2 UTI   Fall at home, initial encounter PT/OT-HH Fall precautions   COPD (chronic obstructive pulmonary disease) ([place]) Without acute exacerbation Continue inhalers   Elevated troponin Troponin level 19 -->19, no chest pain, likely demand ischemia or nonspecific trend. -Aspirin -A1c 5.6   Abnormal LFTs Mild abnormal liver function, AST 59, ALT 51, total bilirubin 0.7, ALP 72, no abdominal pain.  Possibly due to ongoing infection 6/21 trending down.    Iron deficiency anemia Hemoglobin stable, 11.9 -Continue iron supplement                     DVT prophylaxis: Lovenox Code Status: Full Family Communication: Family at bedside Disposition Plan: Awaiting urine culture result Status is: Observation The patient remains OBS appropriate and will d/c before 2 midnights.        LOS: 0 days   Time spent: 35 minutes    Nolberto Hanlon, MD [facility] Pager [phone]  If 7PM-7AM, please contact night-coverage 04/26/2022, 8:37 AM

## 2022-04-26 NOTE — Evaluation (Signed)
Occupational Therapy Evaluation Patient Details Name: Lindsey Burke MRN: [id number] DOB: [birth year] Today's Date: 04/26/2022   History of Present Illness Lindsey Burke is a 88 y.o. female with COPD who comes in with concerns for altered mental status and UTI.  Patient reports not feeling right for the past 2 to 3 weeks.  She reports that she has had some increased urination.  She reports that over the past few days she is had recurrent falls where she just gets weak.  She does not think she hit her head.  However she does have a skin tear on her left arm.   Clinical Impression   Patient presenting with decreased Ind in self care, balance, functional mobility/transfers, endurance, and safety awareness. Patient reports living at home alone with use of [facility] for mobility. Pt has been having multiple falls. Family assists with medications, meals, and taking pt to appointments. At discharge, pt's daughter would like her to be at her home and then transition to getting pt at her home during the day and back at daughter's home at night. OT did recommend pt needing to ambulate with RW at home for safety secondary to multiple falls.  Patient currently functioning at supervision - min guard for self care and functional mobility.  Patient will benefit from acute OT to increase overall independence in the areas of ADLs, functional mobility, and safety awareness in order to safely discharge home with family.      Recommendations for follow up therapy are one component of a multi-disciplinary discharge planning process, led by the attending physician.  Recommendations may be updated based on patient status, additional functional criteria and insurance authorization.   Follow Up Recommendations  Home health OT    Assistance Recommended at Discharge Intermittent Supervision/Assistance  Patient can return home with the following A little help with walking and/or transfers;A little help with  bathing/dressing/bathroom;Assistance with cooking/housework;Direct supervision/assist for medications management;Help with stairs or ramp for entrance;Assist for transportation    Functional Status Assessment  Patient has had a recent decline in their functional status and demonstrates the ability to make significant improvements in function in a reasonable and predictable amount of time.  Equipment Recommendations  None recommended by OT       Precautions / Restrictions Precautions Precautions: Fall      Mobility Bed Mobility Overal bed mobility: Needs Assistance Bed Mobility: Supine to Sit, Sit to Supine     Supine to sit: Supervision Sit to supine: Supervision   General bed mobility comments: no physical assistance    Transfers Overall transfer level: Needs assistance Equipment used: Rolling walker (2 wheels) Transfers: Sit to/from Stand Sit to Stand: Min guard                  Balance Overall balance assessment: Needs assistance Sitting-balance support: No upper extremity supported, Feet supported Sitting balance-Leahy Scale: Good     Standing balance support: No upper extremity supported, During functional activity, Reliant on assistive device for balance Standing balance-Leahy Scale: Fair                             ADL either performed or assessed with clinical judgement   ADL Overall ADL's : Needs assistance/impaired  General ADL Comments: supervision - min guard for self care tasks with min cuing for safety awareness     Vision Patient Visual Report: No change from baseline              Pertinent Vitals/Pain Pain Assessment Pain Assessment: No/denies pain     Hand Dominance Right   Extremity/Trunk Assessment Upper Extremity Assessment Upper Extremity Assessment: Generalized weakness   Lower Extremity Assessment Lower Extremity Assessment: Generalized weakness        Communication Communication Communication: No difficulties   Cognition Arousal/Alertness: Awake/alert Behavior During Therapy: WFL for tasks assessed/performed Overall Cognitive Status: Within Functional Limits for tasks assessed                                                  Home Living Family/patient expects to be discharged to:: Private residence Living Arrangements: Alone Available Help at Discharge: Family;Available 24 hours/day Type of Home: House Home Access: Stairs to enter [employer] of Steps: 3-4 steps Entrance Stairs-Rails: Left;Right Home Layout: One level;Laundry or work area in basement     [employer] Shower/Tub: [employer] Equipment: Agricultural consultant (2 wheels);Cane - single point   Additional Comments: anticipate going to daughter's/neighbor's house after hospital stay.      Prior Functioning/Environment Prior Level of Function : History of Falls (last six months);Independent/Modified Independent             Mobility Comments: Pt ambulates with SPC mostly in home. Multiple falls. ADLs Comments: independent with self care tasks. Family drives her to appointments and helps her with medication and brings meals.        OT Problem List: Decreased strength;Decreased activity tolerance;Decreased safety awareness;Impaired balance (sitting and/or standing);Decreased knowledge of use of DME or AE      OT Treatment/Interventions: Self-care/ADL training;Balance training;Therapeutic exercise;Therapeutic activities;Energy conservation;DME and/or AE instruction;Patient/family education    OT Goals(Current goals can be found in the care plan section) Acute Rehab OT Goals Patient Stated Goal: to go home OT Goal Formulation: With patient/family Time For Goal Achievement: 05/10/22 Potential to Achieve Goals: Fair  OT Frequency: Min 2X/week       AM-PAC OT "6 Clicks" Daily Activity     Outcome Measure Help from  another person eating meals?: None Help from another person taking care of personal grooming?: A Little Help from another person toileting, which includes using toliet, bedpan, or urinal?: A Little Help from another person bathing (including washing, rinsing, drying)?: A Little Help from another person to put on and taking off regular upper body clothing?: None Help from another person to put on and taking off regular lower body clothing?: A Little 6 Click Score: 20   End of Session Equipment Utilized During Treatment: Gait belt Nurse Communication: Mobility status  Activity Tolerance: Patient tolerated treatment well Patient left: in bed;with call bell/phone within reach;with bed alarm set;with family/visitor present  OT Visit Diagnosis: Unsteadiness on feet (R26.81);Repeated falls (R29.6);Muscle weakness (generalized) (M62.81)                Time: 3532-9924 OT Time Calculation (min): 20 min Charges:  OT General Charges $OT Visit: 1 Visit OT Evaluation $OT Eval Moderate Complexity: 1 Mod OT Treatments $Self Care/Home Management : 8-22 mins  Jackquline Denmark, MS, OTR/L , CBIS ascom [phone]  04/26/22, 12:45  PM  

## 2022-04-26 NOTE — Plan of Care (Signed)

## 2022-04-26 NOTE — Progress Notes (Signed)
Met with the patient in the room at the bedside She lives alone but her daughter lives next door She will be staying with her daughter for a bit after discharge She has a cane and a rolling walker at home She declines a 3 in 1 Her daughter takes her to her appointments She declines HH services I encouraged her to let me know if she changes her mind and would like HH No additional needs 

## 2022-04-26 NOTE — Progress Notes (Signed)
Physical Therapy Treatment Patient Details Name: Lindsey Burke MRN: [id number] DOB: December 21, 1933 Today's Date: 04/26/2022   History of Present Illness Lindsey Burke is a 88 y.o. female with COPD who comes in with concerns for altered mental status and UTI.  Patient reports not feeling right for the past 2 to 3 weeks.  She reports that she has had some increased urination.  She reports that over the past few days she is had recurrent falls where she just gets weak.  She does not think she hit her head.  However she does have a skin tear on her left arm. PMH includes hyperlipidemia, COPD, GERD, anxiety, renal stone, mitral valve prolapse, hepatitis, gallstone, dysphagia, iron deficiency anemia    PT Comments    Pt was pleasant and motivated to participate during the session and put forth good effort throughout. Pt was able to complete supine to sit w/ supervision and extra time to complete. Pt is able to perform sit to stand w/ CGA. Pt is able to ambulate 239ft using RW w/ CGA with slow but steady gait and no LOB and min cuing to maintain RW close; decreased foot clearance and step length but no shuffling observed this session. Pt was able to perform x4 steps using rails w/ CGA with good stability and no LOB with ability to use alternating pattern. Pt will benefit from HHPT upon discharge to safely address deficits listed in patient problem list for decreased caregiver assistance and eventual return to PLOF.    Recommendations for follow up therapy are one component of a multi-disciplinary discharge planning process, led by the attending physician.  Recommendations may be updated based on patient status, additional functional criteria and insurance authorization.  Follow Up Recommendations  Home health PT     Assistance Recommended at Discharge Intermittent Supervision/Assistance  Patient can return home with the following A little help with walking and/or transfers;A little help with  bathing/dressing/bathroom;Assistance with cooking/housework;Assist for transportation;Help with stairs or ramp for entrance   Equipment Recommendations  None recommended by PT    Recommendations for Other Services       Precautions / Restrictions Precautions Precautions: Fall Restrictions Weight Bearing Restrictions: No     Mobility  Bed Mobility Overal bed mobility: Needs Assistance Bed Mobility: Supine to Sit     Supine to sit: Supervision     General bed mobility comments: extra time and effort to complete    Transfers Overall transfer level: Needs assistance Equipment used: Rolling walker (2 wheels) Transfers: Sit to/from Stand Sit to Stand: Min guard                Ambulation/Gait Ambulation/Gait assistance: Min guard Gait Distance (Feet): 200 Feet Assistive device: Rolling walker (2 wheels) Gait Pattern/deviations: Step-through pattern, Decreased step length - right, Decreased step length - left Gait velocity: decreased     General Gait Details: slow steady gait w/ no LOB; min cuing to maintain RW close   Stairs Stairs: Yes Stairs assistance: Min guard Stair Management: Two rails, Alternating pattern Number of Stairs: 4 General stair comments: good stability and no LOB, able to complete with alt pattern   Wheelchair Mobility    Modified Rankin (Stroke Patients Only)       Balance Overall balance assessment: Needs assistance Sitting-balance support: No upper extremity supported, Feet supported Sitting balance-Leahy Scale: Good     Standing balance support: Bilateral upper extremity supported, During functional activity Standing balance-Leahy Scale: Fair  Cognition Arousal/Alertness: Awake/alert Behavior During Therapy: WFL for tasks assessed/performed Overall Cognitive Status: Within Functional Limits for tasks assessed                                          Exercises  General Exercises - Lower Extremity Ankle Circles/Pumps: Strengthening, Both, 10 reps Quad Sets: Strengthening, Both, 10 reps Gluteal Sets: Strengthening, Both, 10 reps Long Arc Quad: Strengthening, Both, 10 reps (manual resistance) Heel Slides: Strengthening, Both, 10 reps (manual resistance)    General Comments        Pertinent Vitals/Pain Pain Assessment Pain Assessment: No/denies pain    Home Living                          Prior Function            PT Goals (current goals can now be found in the care plan section) Progress towards PT goals: Progressing toward goals    Frequency    Min 2X/week      PT Plan Current plan remains appropriate    Co-evaluation              AM-PAC PT "6 Clicks" Mobility   Outcome Measure  Help needed turning from your back to your side while in a flat bed without using bedrails?: A Little Help needed moving from lying on your back to sitting on the side of a flat bed without using bedrails?: A Little Help needed moving to and from a bed to a chair (including a wheelchair)?: A Little Help needed standing up from a chair using your arms (e.g., wheelchair or bedside chair)?: A Little Help needed to walk in hospital room?: A Little Help needed climbing 3-5 steps with a railing? : A Little 6 Click Score: 18    End of Session Equipment Utilized During Treatment: Gait belt Activity Tolerance: Patient tolerated treatment well Patient left: with call bell/phone within reach;with family/visitor present;in chair;with chair alarm set Nurse Communication: Mobility status PT Visit Diagnosis: Unsteadiness on feet (R26.81);Other abnormalities of gait and mobility (R26.89);Repeated falls (R29.6);Muscle weakness (generalized) (M62.81);History of falling (Z91.81)     Time: 1350-1415 PT Time Calculation (min) (ACUTE ONLY): 25 min  Charges:                        Marica Otter, SPT  04/26/2022, 3:14 PM

## 2022-04-27 DIAGNOSIS — N3000 Acute cystitis without hematuria: Secondary | ICD-10-CM | POA: Diagnosis not present

## 2022-04-27 DIAGNOSIS — N390 Urinary tract infection, site not specified: Secondary | ICD-10-CM | POA: Diagnosis not present

## 2022-04-27 DIAGNOSIS — R7989 Other specified abnormal findings of blood chemistry: Secondary | ICD-10-CM | POA: Diagnosis not present

## 2022-04-27 DIAGNOSIS — J449 Chronic obstructive pulmonary disease, unspecified: Secondary | ICD-10-CM | POA: Diagnosis not present

## 2022-04-27 DIAGNOSIS — G9341 Metabolic encephalopathy: Secondary | ICD-10-CM | POA: Diagnosis not present

## 2022-04-27 LAB — URINE CULTURE: Culture: 30000 — AB

## 2022-04-27 LAB — GLUCOSE, CAPILLARY: Glucose-Capillary: 93 mg/dL (ref 70–99)

## 2022-04-27 MED ORDER — CEPHALEXIN 500 MG PO CAPS
500.0000 mg | ORAL_CAPSULE | Freq: Three times a day (TID) | ORAL | 0 refills | Status: AC
Start: 2022-04-28 — End: 2022-04-30

## 2022-04-27 NOTE — Discharge Summary (Incomplete)
Lindsey Burke VOZ:[id number] DOB: 03-14-1934 DOA: 04/25/2022  PCP: Jaclyn Shaggy, MD  Admit date: 04/25/2022 Discharge date: 04/27/2022  Admitted From: *** Disposition:  ***  Recommendations for Outpatient Follow-up:  Follow up with PCP in 1 week Please obtain BMP/CBC in one week Please follow up on the following pending results:  Home Health:***    Discharge Condition:Stable CODE STATUS:***  Diet recommendation: Heart Healthy / Carb Modified / Regular / Dysphagia  Brief/Interim Summary:   Discharge Diagnoses:  Principal Problem:   UTI (urinary tract infection) Active Problems:   Acute metabolic encephalopathy   Fall at home, initial encounter   COPD (chronic obstructive pulmonary disease) (HCC)   Elevated troponin   Abnormal LFTs   Iron deficiency anemia    Discharge Instructions  Discharge Instructions     Call MD for:  temperature >100.4   Complete by: As directed    Diet - low sodium heart healthy   Complete by: As directed    Increase activity slowly   Complete by: As directed    No dressing needed   Complete by: As directed       Allergies as of 04/27/2022       Reactions   Augmentin [amoxicillin-pot Clavulanate] Other (See Comments)   Elevated Liver Enzymes        Medication List     STOP taking these medications    alendronate 70 MG tablet Commonly known as: FOSAMAX   nitrofurantoin (macrocrystal-monohydrate) 100 MG capsule Commonly known as: MACROBID       TAKE these medications    aspirin EC 81 MG tablet Take 81 mg by mouth daily.   docusate sodium 100 MG capsule Commonly known as: COLACE Take 100 mg by mouth 2 (two) times daily.   ferrous sulfate 325 (65 FE) MG EC tablet Take 325 mg by mouth 3 (three) times daily with meals.   Fish Oil 1000 MG Caps Take 1 capsule by mouth daily.   gabapentin 300 MG capsule Commonly known as: NEURONTIN Take 1 capsule by mouth at bedtime.   iron polysaccharides 150 MG  capsule Commonly known as: NIFEREX Take 1 capsule (150 mg total) by mouth daily.   LORazepam 0.5 MG tablet Commonly known as: ATIVAN Take 1 tablet by mouth every 8 (eight) hours as needed.   metoprolol tartrate 25 MG tablet Commonly known as: LOPRESSOR Take 1 tablet (25 mg total) by mouth 2 (two) times daily.   omeprazole 40 MG capsule Commonly known as: PRILOSEC Take 1 capsule (40 mg total) by mouth daily.   OS-CAL PO Take 1 tablet by mouth daily.   PRESERVISION AREDS PO Take 1 tablet by mouth daily.   vitamin C 500 MG tablet Commonly known as: ASCORBIC ACID Take 500 mg by mouth daily.               Discharge Care Instructions  (From admission, onward)           Start     Ordered   04/27/22 0000  No dressing needed        04/27/22 1058            Follow-up Information     Jaclyn Shaggy, MD Follow up in 1 week(s).   Specialty: Internal Medicine Contact information: [address]   [place] [address] [phone]                Allergies  Allergen Reactions   Augmentin [Amoxicillin-Pot Clavulanate] Other (  See Comments)    Elevated Liver Enzymes    Consultations:    Procedures/Studies: CT Renal Stone Study  Result Date: 04/25/2022 CLINICAL DATA:  Flank pain, kidney stone suspected. EXAM: CT ABDOMEN AND PELVIS WITHOUT CONTRAST TECHNIQUE: Multidetector CT imaging of the abdomen and pelvis was performed following the standard protocol without IV contrast. RADIATION DOSE REDUCTION: This exam was performed according to the departmental dose-optimization program which includes automated exposure control, adjustment of the mA and/or kV according to patient size and/or use of iterative reconstruction technique. COMPARISON:  CT examination dated May 20, 2014 FINDINGS: Lower chest: No acute abnormality.  Mild bibasilar fibrotic changes. Hepatobiliary: No focal liver abnormality is seen. Multiple dependent gallstones without gallbladder  wall thickening, or biliary dilatation. Pancreas: Unremarkable. No pancreatic ductal dilatation or surrounding inflammatory changes. Spleen: Normal in size without focal abnormality. Adrenals/Urinary Tract: Adrenal glands are unremarkable. Multiple right renal calculi for example a 4 mm calculus in the midpole and two other midpole calculi measuring 3 and 2 mm. No hydronephrosis or ureteral calculus identified. Simple exophytic cyst in the lower pole of the right kidney, unchanged, no further imaging follow-up is recommended. Left kidney and ureter are unremarkable. Bladder is unremarkable. Stomach/Bowel: Stomach is within normal limits. Appendix not identified. Moderate amount of stool in the colon. No evidence of bowel wall thickening, distention, or inflammatory changes. Vascular/Lymphatic: Aortic atherosclerosis. No enlarged abdominal or pelvic lymph nodes. Reproductive: Uterus and bilateral adnexa are unremarkable. Other: No abdominal wall hernia or abnormality. No abdominopelvic ascites. Musculoskeletal: Hypodense lesion in the L3 vertebral body, unchanged from 2015, most consistent with a hemangioma. Degenerate disc disease of the lumbar spine. No acute fracture. IMPRESSION: 1.  Cholelithiasis without evidence of acute cholecystitis. 2. Multiple right renal calculi the largest measuring up to 4 mm without evidence of hydronephrosis or ureteral calculus. 3. Moderate amount of retained colonic stool suggesting constipation. Bowel loops are normal in caliber. No evidence of colitis or diverticulitis. 4.  No CT evidence of acute abdominal/pelvic process. Electronically Signed   By: Keane Police D.O.   On: 04/25/2022 10:05   CT HEAD WO CONTRAST (5MM)  Result Date: 04/25/2022 CLINICAL DATA:  Neck trauma EXAM: CT HEAD WITHOUT CONTRAST CT CERVICAL SPINE WITHOUT CONTRAST TECHNIQUE: Multidetector CT imaging of the head and cervical spine was performed following the standard protocol without intravenous contrast.  Multiplanar CT image reconstructions of the cervical spine were also generated. RADIATION DOSE REDUCTION: This exam was performed according to the departmental dose-optimization program which includes automated exposure control, adjustment of the mA and/or kV according to patient size and/or use of iterative reconstruction technique. COMPARISON:  Head CT dated October 12, 2021 FINDINGS: CT HEAD FINDINGS Brain: No evidence of acute infarction, hemorrhage, hydrocephalus, extra-axial collection or mass lesion/mass effect. Vascular: No hyperdense vessel or unexpected calcification. Skull: Normal. Negative for fracture or focal lesion. Sinuses/Orbits: No acute finding. Other: None. CT CERVICAL SPINE FINDINGS Alignment: Normal. Skull base and vertebrae: No acute fracture. No primary bone lesion or focal pathologic process. Soft tissues and spinal canal: No prevertebral fluid or swelling. No visible canal hematoma. Disc levels:  Mild multilevel degenerative disc disease. Upper chest: Biapical pleural-parenchymal scarring. Other: None IMPRESSION: 1. No acute intracranial abnormality. 2. No evidence of acute cervical spine fracture or traumatic malalignment. Electronically Signed   By: Yetta Glassman M.D.   On: 04/25/2022 09:57   CT Cervical Spine Wo Contrast  Result Date: 04/25/2022 CLINICAL DATA:  Neck trauma EXAM: CT HEAD WITHOUT CONTRAST CT CERVICAL  SPINE WITHOUT CONTRAST TECHNIQUE: Multidetector CT imaging of the head and cervical spine was performed following the standard protocol without intravenous contrast. Multiplanar CT image reconstructions of the cervical spine were also generated. RADIATION DOSE REDUCTION: This exam was performed according to the departmental dose-optimization program which includes automated exposure control, adjustment of the mA and/or kV according to patient size and/or use of iterative reconstruction technique. COMPARISON:  Head CT dated October 12, 2021 FINDINGS: CT HEAD FINDINGS  Brain: No evidence of acute infarction, hemorrhage, hydrocephalus, extra-axial collection or mass lesion/mass effect. Vascular: No hyperdense vessel or unexpected calcification. Skull: Normal. Negative for fracture or focal lesion. Sinuses/Orbits: No acute finding. Other: None. CT CERVICAL SPINE FINDINGS Alignment: Normal. Skull base and vertebrae: No acute fracture. No primary bone lesion or focal pathologic process. Soft tissues and spinal canal: No prevertebral fluid or swelling. No visible canal hematoma. Disc levels:  Mild multilevel degenerative disc disease. Upper chest: Biapical pleural-parenchymal scarring. Other: None IMPRESSION: 1. No acute intracranial abnormality. 2. No evidence of acute cervical spine fracture or traumatic malalignment. Electronically Signed   By: Yetta Glassman M.D.   On: 04/25/2022 09:57   DG Elbow Complete Left  Result Date: 04/25/2022 CLINICAL DATA:  Fall. EXAM: LEFT ELBOW - COMPLETE 3+ VIEW COMPARISON:  None Available. FINDINGS: There is no evidence of fracture, dislocation, or joint effusion. There is no evidence of arthropathy or other focal bone abnormality. Soft tissues are unremarkable. IMPRESSION: Negative. Electronically Signed   By: Marijo Conception M.D.   On: 04/25/2022 09:50   MM 3D SCREEN BREAST W/IMPLANT BILATERAL  Result Date: 03/31/2022 CLINICAL DATA:  Screening. Interval weight loss EXAM: DIGITAL SCREENING BILATERAL MAMMOGRAM WITH IMPLANTS, CAD AND TOMOSYNTHESIS TECHNIQUE: Bilateral screening digital craniocaudal and mediolateral oblique mammograms were obtained. Bilateral screening digital breast tomosynthesis was performed. The images were evaluated with computer-aided detection. Standard and/or implant displaced views were performed. COMPARISON:  Previous exam(s). ACR Breast Density Category b: There are scattered areas of fibroglandular density. FINDINGS: The patient has retropectoral saline implants. There are no findings suspicious for malignancy.  IMPRESSION: No mammographic evidence of malignancy. A result letter of this screening mammogram will be mailed directly to the patient. RECOMMENDATION: Screening mammogram in one year. (Code:[id number]-B-01Y) BI-RADS CATEGORY  1:  Negative. Electronically Signed   By: Valentino Saxon M.D.   On: 03/31/2022 07:53      Subjective:   Discharge Exam: Vitals:   04/27/22 0417 04/27/22 0840  BP: 103/63 (!) 104/53  Pulse: 84 88  Resp: 20 16  Temp: 98.3 F (36.8 C) 99.6 F (37.6 C)  SpO2: 96% 96%   Vitals:   04/26/22 1736 04/26/22 2000 04/27/22 0417 04/27/22 0840  BP: 115/64 117/65 103/63 (!) 104/53  Pulse: 91 93 84 88  Resp: 18 14 20 16   Temp: 98.3 F (36.8 C) 99 F (37.2 C) 98.3 F (36.8 C) 99.6 F (37.6 C)  TempSrc: Oral     SpO2: 98% 100% 96% 96%  Weight:      Height:        General: Pt is alert, awake, not in acute distress Cardiovascular: RRR, S1/S2 +, no rubs, no gallops Respiratory: CTA bilaterally, no wheezing, no rhonchi Abdominal: Soft, NT, ND, bowel sounds + Extremities: no edema, no cyanosis    The results of significant diagnostics from this hospitalization (including imaging, microbiology, ancillary and laboratory) are listed below for reference.     Microbiology: Recent Results (from the past 240 hour(s))  Urine Culture  Status: Abnormal   Collection Time: 04/25/22  8:12 AM   Specimen: Urine, Random  Result Value Ref Range Status   Specimen Description   Final    URINE, RANDOM Performed at [facility], [address] [place], [place] [address]    Special Requests   Final    NONE Performed at [facility], [address] [place], [place] [address]    Culture 30,000 COLONIES/mL ESCHERICHIA COLI (A)  Final   Report Status 04/27/2022 FINAL  Final   Organism ID, Bacteria ESCHERICHIA COLI (A)  Final      Susceptibility   Escherichia coli - MIC*    AMPICILLIN 4 SENSITIVE Sensitive     CEFAZOLIN <=4 SENSITIVE Sensitive     CEFEPIME  <=0.12 SENSITIVE Sensitive     CEFTRIAXONE <=0.25 SENSITIVE Sensitive     CIPROFLOXACIN <=0.25 SENSITIVE Sensitive     GENTAMICIN <=1 SENSITIVE Sensitive     IMIPENEM <=0.25 SENSITIVE Sensitive     NITROFURANTOIN <=16 SENSITIVE Sensitive     TRIMETH/SULFA <=20 SENSITIVE Sensitive     AMPICILLIN/SULBACTAM <=2 SENSITIVE Sensitive     PIP/TAZO <=4 SENSITIVE Sensitive     * 30,000 COLONIES/mL ESCHERICHIA COLI  SARS Coronavirus 2 by RT PCR (hospital order, performed in [facility] hospital lab) *cepheid single result test* Anterior Nasal Swab     Status: None   Collection Time: 04/25/22 10:26 AM   Specimen: Anterior Nasal Swab  Result Value Ref Range Status   SARS Coronavirus 2 by RT PCR NEGATIVE NEGATIVE Final    Comment: (NOTE) SARS-CoV-2 target nucleic acids are NOT DETECTED.  The SARS-CoV-2 RNA is generally detectable in upper and lower respiratory specimens during the acute phase of infection. The lowest concentration of SARS-CoV-2 viral copies this assay can detect is 250 copies / mL. A negative result does not preclude SARS-CoV-2 infection and should not be used as the sole basis for treatment or other patient management decisions.  A negative result may occur with improper specimen collection / handling, submission of specimen other than nasopharyngeal swab, presence of viral mutation(s) within the areas targeted by this assay, and inadequate number of viral copies (<250 copies / mL). A negative result must be combined with clinical observations, patient history, and epidemiological information.  Fact Sheet for Patients:   RoadLapTop.co.za  Fact Sheet for Healthcare Providers: http://kim-miller.com/  This test is not yet approved or  cleared by the [place] FDA and has been authorized for detection and/or diagnosis of SARS-CoV-2 by FDA under an Emergency Use Authorization (EUA).  This EUA will remain in effect (meaning this  test can be used) for the duration of the COVID-19 declaration under Section 564(b)(1) of the Act, 21 U.S.C. section 360bbb-3(b)(1), unless the authorization is terminated or revoked sooner.  Performed at [facility], [address] [place], [place] [address]      Labs: BNP (last 3 results) No results for input(s): "BNP" in the last 8760 hours. Basic Metabolic Panel: Recent Labs  Lab 04/25/22 0812 04/26/22 0439  NA 141 144  K 3.5 3.7  CL 106 110  CO2 26 29  GLUCOSE 115* 98  BUN 16 14  CREATININE 0.51 0.61  CALCIUM 9.0 8.6*   Liver Function Tests: Recent Labs  Lab 04/25/22 0812 04/26/22 0439  AST 59* 37  ALT 61* 45*  ALKPHOS 72 67  BILITOT 0.7 0.7  PROT 6.5 5.6*  ALBUMIN 3.0* 2.5*   No results for input(s): "LIPASE", "AMYLASE" in the  last 168 hours. Recent Labs  Lab 04/25/22 0949  AMMONIA <10   CBC: Recent Labs  Lab 04/25/22 0812  WBC 7.7  HGB 11.9*  HCT 37.5  MCV 94.7  PLT 205   Cardiac Enzymes: Recent Labs  Lab 04/25/22 0949  CKTOTAL 70   BNP: Invalid input(s): "POCBNP" CBG: Recent Labs  Lab 04/27/22 0746  GLUCAP 93   D-Dimer No results for input(s): "DDIMER" in the last 72 hours. Hgb A1c Recent Labs    04/25/22 0812  HGBA1C 5.6   Lipid Profile Recent Labs    04/26/22 0439  CHOL 126  HDL 35*  LDLCALC 74  TRIG 87  CHOLHDL 3.6   Thyroid function studies No results for input(s): "TSH", "T4TOTAL", "T3FREE", "THYROIDAB" in the last 72 hours.  Invalid input(s): "FREET3" Anemia work up No results for input(s): "VITAMINB12", "FOLATE", "FERRITIN", "TIBC", "IRON", "RETICCTPCT" in the last 72 hours. Urinalysis    Component Value Date/Time   COLORURINE YELLOW (A) 04/25/2022 0812   APPEARANCEUR CLOUDY (A) 04/25/2022 0812   LABSPEC 1.018 04/25/2022 0812   PHURINE 5.0 04/25/2022 0812   GLUCOSEU NEGATIVE 04/25/2022 0812   HGBUR MODERATE (A) 04/25/2022 0812   BILIRUBINUR NEGATIVE 04/25/2022 0812   KETONESUR 20 (A)  04/25/2022 0812   PROTEINUR 100 (A) 04/25/2022 0812   NITRITE NEGATIVE 04/25/2022 0812   LEUKOCYTESUR MODERATE (A) 04/25/2022 0812   Sepsis Labs Recent Labs  Lab 04/25/22 0812  WBC 7.7   Microbiology Recent Results (from the past 240 hour(s))  Urine Culture     Status: Abnormal   Collection Time: 04/25/22  8:12 AM   Specimen: Urine, Random  Result Value Ref Range Status   Specimen Description   Final    URINE, RANDOM Performed at [facility], [address] [place], [place] [address]    Special Requests   Final    NONE Performed at [facility], [place], [place] [address]    Culture 30,000 COLONIES/mL ESCHERICHIA COLI (A)  Final   Report Status 04/27/2022 FINAL  Final   Organism ID, Bacteria ESCHERICHIA COLI (A)  Final      Susceptibility   Escherichia coli - MIC*    AMPICILLIN 4 SENSITIVE Sensitive     CEFAZOLIN <=4 SENSITIVE Sensitive     CEFEPIME <=0.12 SENSITIVE Sensitive     CEFTRIAXONE <=0.25 SENSITIVE Sensitive     CIPROFLOXACIN <=0.25 SENSITIVE Sensitive     GENTAMICIN <=1 SENSITIVE Sensitive     IMIPENEM <=0.25 SENSITIVE Sensitive     NITROFURANTOIN <=16 SENSITIVE Sensitive     TRIMETH/SULFA <=20 SENSITIVE Sensitive     AMPICILLIN/SULBACTAM <=2 SENSITIVE Sensitive     PIP/TAZO <=4 SENSITIVE Sensitive     * 30,000 COLONIES/mL ESCHERICHIA COLI  SARS Coronavirus 2 by RT PCR (hospital order, performed in [place] hospital lab) *cepheid single result test* Anterior Nasal Swab     Status: None   Collection Time: 04/25/22 10:26 AM   Specimen: Anterior Nasal Swab  Result Value Ref Range Status   SARS Coronavirus 2 by RT PCR NEGATIVE NEGATIVE Final    Comment: (NOTE) SARS-CoV-2 target nucleic acids are NOT DETECTED.  The SARS-CoV-2 RNA is generally detectable in upper and lower respiratory specimens during the acute phase of infection. The lowest concentration of SARS-CoV-2 viral copies this assay can detect is 250 copies /  mL. A negative result does not preclude SARS-CoV-2 infection and should not be used as the sole basis for treatment or other patient  management decisions.  A negative result may occur with improper specimen collection / handling, submission of specimen other than nasopharyngeal swab, presence of viral mutation(s) within the areas targeted by this assay, and inadequate number of viral copies (<250 copies / mL). A negative result must be combined with clinical observations, patient history, and epidemiological information.  Fact Sheet for Patients:   RoadLapTop.co.za  Fact Sheet for Healthcare Providers: http://kim-miller.com/  This test is not yet approved or  cleared by the [place] FDA and has been authorized for detection and/or diagnosis of SARS-CoV-2 by FDA under an Emergency Use Authorization (EUA).  This EUA will remain in effect (meaning this test can be used) for the duration of the COVID-19 declaration under Section 564(b)(1) of the Act, 21 U.S.C. section 360bbb-3(b)(1), unless the authorization is terminated or revoked sooner.  Performed at [facility], [address], [place], [place] [address]      Time coordinating discharge: Over 30 minutes  SIGNED:   Lynn Ito, MD  [facility] 04/27/2022, 10:58 AM Pager   If 7PM-7AM, please contact night-coverage [email or web address] Password [email or web address]

## 2022-04-27 NOTE — Progress Notes (Signed)
Discharge Note: Reviewed discharge instructions. Pt verbalized understanding. Pt discharged with all personal belongings. Staff wheeled pt out. Pt transported to home via family vehicle.

## 2023-03-19 ENCOUNTER — Emergency Department
Admission: EM | Admit: 2023-03-19 | Discharge: 2023-03-20 | Disposition: A | Discharge status: Home / Self Care | Payer: Medicare Other | Attending: Emergency Medicine | Admitting: Emergency Medicine

## 2023-03-19 ENCOUNTER — Encounter: Payer: Self-pay | Admitting: Emergency Medicine

## 2023-03-19 ENCOUNTER — Emergency Department: Payer: Medicare Other

## 2023-03-19 DIAGNOSIS — N390 Urinary tract infection, site not specified: Secondary | ICD-10-CM | POA: Diagnosis not present

## 2023-03-19 DIAGNOSIS — M25511 Pain in right shoulder: Secondary | ICD-10-CM | POA: Insufficient documentation

## 2023-03-19 MED ORDER — HYDROCODONE-ACETAMINOPHEN 5-325 MG PO TABS: 1.0000 | ORAL_TABLET | Freq: Four times a day (QID) | ORAL | 0 refills | Status: AC | PRN

## 2023-03-19 MED ORDER — HYDROCODONE-ACETAMINOPHEN 5-325 MG PO TABS
1.0000 | ORAL_TABLET | Freq: Once | ORAL | Status: AC
  Administered 2023-03-19: 1 via ORAL
  Filled 2023-03-19: qty 1

## 2023-03-19 MED ORDER — ONDANSETRON 4 MG PO TBDP
4.0000 mg | ORAL_TABLET | Freq: Once | ORAL | Status: AC
  Administered 2023-03-19: 4 mg via ORAL
  Filled 2023-03-19: qty 1

## 2023-03-19 NOTE — ED Triage Notes (Signed)
Pt presents ambulatory to triage via POV with complaints of R shoulder pain that started yesterday. Pt endorses some improvement to the pain when her arm is held against her body. Denies falls, heavy lifting, or known injury. A&Ox4 at this time. Denies CP or SOB.

## 2023-03-19 NOTE — ED Provider Notes (Signed)
[facility] Provider Note  Patient Contact: 11:07 PM (approximate)   History   Shoulder Pain   HPI  Lindsey Burke is a 89 y.o. female presents to the emergency department with right shoulder pain.  Patient reports that she has been doing exercises at home that involve deep range of motion at the neck and states that she now has pain that radiates along her right upper trapezius.  She denies falls or mechanisms of trauma.  She denies chest pain, chest tightness, shortness of breath or cough.  Patient is accompanied by her daughter who denies a prodrome of recent illness.  No nausea, vomiting or diarrhea.      Physical Exam   Triage Vital Signs: ED Triage Vitals  Enc Vitals Group     BP 03/19/23 1958 (!) 150/86     Pulse Rate 03/19/23 1958 (!) 104     Resp 03/19/23 1958 16     Temp 03/19/23 1958 98.6 F (37 C)     Temp Source 03/19/23 1958 Oral     SpO2 03/19/23 1958 94 %     Weight 03/19/23 1959 83 lb (37.6 kg)     Height 03/19/23 1959 5' (1.524 m)     Head Circumference --      Peak Flow --      Pain Score 03/19/23 2014 8     Pain Loc --      Pain Edu? --      Excl. in GC? --     Most recent vital signs: Vitals:   03/19/23 1958  BP: (!) 150/86  Pulse: (!) 104  Resp: 16  Temp: 98.6 F (37 C)  SpO2: 94%     General: Alert and in no acute distress. Eyes:  PERRL. EOMI. Head: No acute traumatic findings ENT:      Nose: No congestion/rhinnorhea.      Mouth/Throat: Mucous membranes are moist. Neck: No stridor. No cervical spine tenderness to palpation. Cardiovascular:  Good peripheral perfusion Respiratory: Normal respiratory effort without tachypnea or retractions. Lungs CTAB. Good air entry to the bases with no decreased or absent breath sounds. Gastrointestinal: Bowel sounds 4 quadrants. Soft and nontender to palpation. No guarding or rigidity. No palpable masses. No distention. No CVA tenderness. Musculoskeletal: Full range of  motion to all extremities.  Palpable radial and ulnar pulses bilaterally and symmetrically. Neurologic:  No gross focal neurologic deficits are appreciated.  Skin:   No rash noted Other:   ED Results / Procedures / Treatments   Labs (all labs ordered are listed, but only abnormal results are displayed) Labs Reviewed  URINALYSIS, ROUTINE W REFLEX MICROSCOPIC        RADIOLOGY  I personally viewed and evaluated these images as part of my medical decision making, as well as reviewing the written report by the radiologist.  ED Provider Interpretation: No acute abnormality on x-ray of the right shoulder.   PROCEDURES:  Critical Care performed: No  Procedures   MEDICATIONS ORDERED IN ED: Medications  ondansetron (ZOFRAN-ODT) disintegrating tablet 4 mg (4 mg Oral Given 03/19/23 2336)  HYDROcodone-acetaminophen (NORCO/VICODIN) 5-325 MG per tablet 1 tablet (1 tablet Oral Given 03/19/23 2336)     IMPRESSION / MDM / ASSESSMENT AND PLAN / ED COURSE  I reviewed the triage vital signs and the nursing notes.  Assessment and plan Right shoulder pain 89 year old female presents to the emergency department with acute right shoulder pain over the past 2 days after doing neck exercises at home.  Patient was hypertensive and mildly tachycardic at triage but vital signs were otherwise reassuring.  On exam, patient was alert and nontoxic-appearing without other neurodeficits noted.  X-ray of the right shoulder shows no acute abnormality.  Will obtain urinalysis and will reassess.   Patient report given to attending, Dr. York Cerise at shift change.  Patient given Norco for pain.  Patient was also prescribed a short course of Norco for right shoulder pain.  Dr. York Cerise will assess need for UTI after reviewing urinalysis.   FINAL CLINICAL IMPRESSION(S) / ED DIAGNOSES   Final diagnoses:  Acute pain of right shoulder     Rx / DC Orders   ED Discharge Orders           Ordered    HYDROcodone-acetaminophen (NORCO) 5-325 MG tablet  Every 6 hours PRN        03/19/23 2346             Note:  This document was prepared using Dragon voice recognition software and may include unintentional dictation errors.   Pia Mau [place], PA-C 03/19/23 2347    Chesley Noon, MD 03/20/23 2245

## 2023-03-19 NOTE — ED Provider Notes (Signed)
-----------------------------------------   11:46 PM on 03/19/2023 -----------------------------------------  Assuming care from [facility].  In short, Lindsey Burke is a 89 y.o. female with a chief complaint of shoulder pain.  Refer to the original H&P for additional details.  The current plan of care is to review results of urinalysis, obtained at patient request.   Clinical Course as of 03/20/23 0818  Tue Mar 20, 2023  0035 Urinalysis, Routine w reflex microscopic -Urine, Clean Catch(!) Grossly infected urine.  Urinalysis from approximately 11 months ago shows E. coli that is pansensitive.  I will treat with a dose of Keflex and a prescription for cefadroxil and recommend close outpatient follow-up. [CF]    Clinical Course User Index [CF] Loleta Rose, MD     Medications  ondansetron (ZOFRAN-ODT) disintegrating tablet 4 mg (4 mg Oral Given 03/19/23 2336)  HYDROcodone-acetaminophen (NORCO/VICODIN) 5-325 MG per tablet 1 tablet (1 tablet Oral Given 03/19/23 2336)  cephALEXin (KEFLEX) capsule 500 mg (500 mg Oral Given 03/20/23 0047)     ED Discharge Orders          Ordered    cefadroxil (DURICEF) 500 MG capsule  2 times daily        03/20/23 0048    HYDROcodone-acetaminophen (NORCO) 5-325 MG tablet  Every 6 hours PRN        03/19/23 2346           Final diagnoses:  Acute pain of right shoulder  Urinary tract infection without hematuria, site unspecified     Loleta Rose, MD 03/20/23 [phone]

## 2023-03-20 LAB — URINALYSIS, ROUTINE W REFLEX MICROSCOPIC
Bilirubin Urine: NEGATIVE
Glucose, UA: NEGATIVE mg/dL
Ketones, ur: 15 mg/dL — AB
Nitrite: POSITIVE — AB
Protein, ur: >300 mg/dL — AB
RBC / HPF: >50 RBC/hpf (ref 0–5)
Specific Gravity, Urine: >1.03 — ABNORMAL HIGH (ref 1.005–1.030)
Squamous Epithelial / HPF: NONE SEEN /HPF (ref 0–5)
WBC, UA: >50 WBC/hpf (ref 0–5)
pH: 6 (ref 5.0–8.0)

## 2023-03-20 MED ORDER — CEFADROXIL 500 MG PO CAPS: 500.0000 mg | ORAL_CAPSULE | Freq: Two times a day (BID) | ORAL | 0 refills | Status: AC

## 2023-03-20 MED ORDER — CEPHALEXIN 500 MG PO CAPS
500.0000 mg | ORAL_CAPSULE | Freq: Once | ORAL | Status: AC
  Administered 2023-03-20: 500 mg via ORAL
  Filled 2023-03-20: qty 1

## 2023-03-20 NOTE — Discharge Instructions (Signed)
Please follow-up with your primary care doctor regarding the pain in your shoulder and your urinary tract infection.  There is no evidence of a fracture on your shoulder x-rays, but she may benefit from additional treatment.  You could also consider following up with the orthopedics clinic at the number provider (Dr. Joice Lofts or one of his colleagues).

## 2023-03-22 LAB — URINE CULTURE: Culture: >=100000 — AB

## 2023-07-12 ENCOUNTER — Ambulatory Visit: Payer: Medicare Other | Admitting: Urology

## 2023-07-12 VITALS — BP 110/66 | HR 79 | Ht 60.0 in | Wt 83.0 lb

## 2023-07-12 DIAGNOSIS — N390 Urinary tract infection, site not specified: Secondary | ICD-10-CM

## 2023-07-12 DIAGNOSIS — Z87440 Personal history of urinary (tract) infections: Secondary | ICD-10-CM | POA: Diagnosis not present

## 2023-07-12 DIAGNOSIS — N200 Calculus of kidney: Secondary | ICD-10-CM | POA: Diagnosis not present

## 2023-07-12 DIAGNOSIS — N3000 Acute cystitis without hematuria: Secondary | ICD-10-CM

## 2023-07-12 LAB — BLADDER SCAN AMB NON-IMAGING: Scan Result: 0

## 2023-07-12 MED ORDER — SULFAMETHOXAZOLE-TRIMETHOPRIM 800-160 MG PO TABS: 1.0000 | ORAL_TABLET | Freq: Two times a day (BID) | ORAL | 0 refills | Status: AC

## 2023-07-12 NOTE — Progress Notes (Signed)
I, Duke Salvia, acting as a Neurosurgeon for Riki Altes, MD., have documented all relevant documentation on the behalf of Riki Altes, MD, as directed by  Riki Altes, MD while in the presence of Riki Altes, MD.  07/12/2023 2:30 PM   Lindsey Burke 1934-08-17 [id number]  Referring provider: Jaclyn Shaggy, MD [address]   [place],  [place] [address]  Chief Complaint  Patient presents with   Establish Care    HPI: Lindsey Burke is a 89 y.o. female referred for evaluation of recurrent UTIs. She presents today with her daughter who provided the majority of the history.  She states she has been treated for 6 UTIs in the last 7 months. Several cultures positive for E.coli. Daughter states her primary symptom seems to be increased confusion, which resolves after antibiotic therapy. No significant LUTS or gross hematuria. CT abdomen pelvis without contrast performed in 2023 showed non-obstructing right renal calculi.    PMH: Past Medical History:  Diagnosis Date   Back pain    Cholelithiasis    COPD (chronic obstructive pulmonary disease) (HCC)    Gallstones    GERD (gastroesophageal reflux disease)    Hammertoe    Hepatitis    Hyperlipidemia    Kidney stones 1970   Macular degeneration    MVP (mitral valve prolapse)    Nephrolithiasis    Ovarian cyst 2015   Ovarian cyst     Surgical History: Past Surgical History:  Procedure Laterality Date   AUGMENTATION MAMMAPLASTY Bilateral    bilateral implants   BREAST SURGERY     implants   colonscopy  2012   Unremarkable-Dr Mechele Collin   ESOPHAGOGASTRODUODENOSCOPY (EGD) WITH PROPOFOL N/A 06/18/2015   Procedure: ESOPHAGOGASTRODUODENOSCOPY (EGD) WITH PROPOFOL;  Surgeon: Scot Jun, MD;  Location[facility] ENDOSCOPY;  Service: Endoscopy;  Laterality: N/A;   ESOPHAGOGASTRODUODENOSCOPY (EGD) WITH PROPOFOL N/A 10/16/2021   Procedure: ESOPHAGOGASTRODUODENOSCOPY (EGD) WITH PROPOFOL;  Surgeon: Toney Reil, MD;  Location: [facility] ENDOSCOPY;  Service: Gastroenterology;  Laterality: N/A;   SAVORY DILATION N/A 06/18/2015   Procedure: SAVORY DILATION;  Surgeon: Scot Jun, MD;  Location: [facility] ENDOSCOPY;  Service: Endoscopy;  Laterality: N/A;    Home Medications:  Allergies as of 07/12/2023       Reactions   Augmentin [amoxicillin-pot Clavulanate] Other (See Comments)   Elevated Liver Enzymes        Medication List        Accurate as of July 12, 2023  2:30 PM. If you have any questions, ask your nurse or doctor.          STOP taking these medications    iron polysaccharides 150 MG capsule Commonly known as: NIFEREX   metoprolol tartrate 25 MG tablet Commonly known as: LOPRESSOR       TAKE these medications    ascorbic acid 500 MG tablet Commonly known as: VITAMIN C Take 500 mg by mouth daily.   aspirin EC 81 MG tablet Take 81 mg by mouth daily.   docusate sodium 100 MG capsule Commonly known as: COLACE Take 100 mg by mouth 2 (two) times daily.   ferrous sulfate 325 (65 FE) MG EC tablet Take 325 mg by mouth 3 (three) times daily with meals.   Fish Oil 1000 MG Caps Take 1 capsule by mouth daily.   gabapentin 300 MG capsule Commonly known as: NEURONTIN Take 1 capsule by mouth at bedtime.   LORazepam 0.5 MG tablet  Commonly known as: ATIVAN Take 1 tablet by mouth every 8 (eight) hours as needed.   omeprazole 40 MG capsule Commonly known as: PRILOSEC Take 1 capsule (40 mg total) by mouth daily.   OS-CAL PO Take 1 tablet by mouth daily.   PRESERVISION AREDS PO Take 1 tablet by mouth daily.   sulfamethoxazole-trimethoprim 800-160 MG tablet Commonly known as: BACTRIM DS Take 1 tablet by mouth 2 (two) times daily for 7 days.        Allergies:  Allergies  Allergen Reactions   Augmentin [Amoxicillin-Pot Clavulanate] Other (See Comments)    Elevated Liver Enzymes    Family History: Family History  Problem Relation Age of Onset    Cancer Mother    Cholelithiasis Father    Tuberculosis Father    Cancer Father        Prostate and Larynx   Cholelithiasis Sister    Breast cancer Maternal Aunt     Social History:  reports that she has never smoked. She has never used smokeless tobacco. She reports current alcohol use of about 1.0 standard drink of alcohol per week. She reports that she does not use drugs.   Physical Exam: BP 110/66   Pulse 79   Ht 5' (1.524 m)   Wt 83 lb (37.6 kg)   BMI 16.21 kg/m   Constitutional:  Alert and oriented, No acute distress. HEENT: [place] AT Respiratory: Normal respiratory effort, no increased work of breathing. Psychiatric: Normal mood and affect.   Laboratory Data:  Urinalysis Dipstick trace protein/ 1+leukocytes. Microscopy >30 WBC/ many bacteria.    Assessment & Plan:    1. Recurrent UTI Multiple positive cultures for E.coli. Confusion is a soft UTI symptom and it's association with UTI is controversial. PVR today 0 mL. Urine culture ordered. Sensor symptom of confusion does improce after antibiotic therapy. Rx Septra DS sent to pharmacy pending urine culture results. Once she completes this current antibiotic course,  we'll place a low dose antibiotic prophylaxis with trimethoprim 100 mg daily. PA follow up in 3 months for symptom recheck. We also discussed supplements of cranberry and D-mannose for UTI prevention.  2. Nephrolithiasis Renal ultrasound ordered.  I have reviewed the above documentation for accuracy and completeness, and I agree with the above.   Riki Altes, MD  [facility] [address] [place], [place] [address] [phone]

## 2023-07-13 ENCOUNTER — Encounter: Payer: Self-pay | Admitting: Urology

## 2023-07-13 LAB — MICROSCOPIC EXAMINATION: WBC, UA: >30 /HPF — AB (ref 0–5)

## 2023-07-13 LAB — URINALYSIS, COMPLETE
Bilirubin, UA: NEGATIVE
Glucose, UA: NEGATIVE
Ketones, UA: NEGATIVE
Nitrite, UA: NEGATIVE
RBC, UA: NEGATIVE
Specific Gravity, UA: 1.02 (ref 1.005–1.030)
Urobilinogen, Ur: 0.2 mg/dL (ref 0.2–1.0)
pH, UA: 5.5 (ref 5.0–7.5)

## 2023-07-15 LAB — CULTURE, URINE COMPREHENSIVE

## 2023-07-16 ENCOUNTER — Other Ambulatory Visit: Payer: Self-pay | Admitting: *Deleted

## 2023-07-16 MED ORDER — TRIMETHOPRIM 100 MG PO TABS: 100.0000 mg | ORAL_TABLET | Freq: Every day | ORAL | 6 refills | Status: DC

## 2023-07-25 ENCOUNTER — Ambulatory Visit
Admission: RE | Admit: 2023-07-25 | Discharge: 2023-07-25 | Disposition: A | Discharge status: Home / Self Care | Payer: Medicare Other | Source: Ambulatory Visit | Attending: Urology | Admitting: Urology

## 2023-07-25 DIAGNOSIS — Z87440 Personal history of urinary (tract) infections: Secondary | ICD-10-CM | POA: Diagnosis present

## 2023-07-25 DIAGNOSIS — N3000 Acute cystitis without hematuria: Secondary | ICD-10-CM | POA: Insufficient documentation

## 2023-10-11 ENCOUNTER — Ambulatory Visit: Payer: Medicare Other | Admitting: Physician Assistant

## 2023-10-11 ENCOUNTER — Encounter: Payer: Self-pay | Admitting: Physician Assistant

## 2023-10-11 VITALS — Ht 60.0 in | Wt 81.0 lb

## 2023-10-11 DIAGNOSIS — N3281 Overactive bladder: Secondary | ICD-10-CM | POA: Diagnosis not present

## 2023-10-11 DIAGNOSIS — R82998 Other abnormal findings in urine: Secondary | ICD-10-CM | POA: Diagnosis not present

## 2023-10-11 DIAGNOSIS — Z87440 Personal history of urinary (tract) infections: Secondary | ICD-10-CM | POA: Diagnosis not present

## 2023-10-11 DIAGNOSIS — N390 Urinary tract infection, site not specified: Secondary | ICD-10-CM

## 2023-10-11 LAB — URINALYSIS, COMPLETE
Bilirubin, UA: NEGATIVE
Glucose, UA: NEGATIVE
Ketones, UA: NEGATIVE
Nitrite, UA: NEGATIVE
Specific Gravity, UA: >1.03 — ABNORMAL HIGH (ref 1.005–1.030)
Urobilinogen, Ur: 0.2 mg/dL (ref 0.2–1.0)
pH, UA: 5.5 (ref 5.0–7.5)

## 2023-10-11 LAB — MICROSCOPIC EXAMINATION

## 2023-10-11 NOTE — Progress Notes (Signed)
10/11/2023 1:38 PM   Lindsey Burke 29-Feb-1934 [id number]  CC: Chief Complaint  Patient presents with   Cystitis   HPI: Lindsey Burke is a 89 y.o. female with PMH recurrent UTI on suppressive trimethoprim who presents today for follow-up.  She is accompanied today by her daughter, who contributes to HPI.  Today she reports she is doing well on daily trimethoprim.  She has had no episodes of confusion since starting it.  She also reports chronic urgency, urge incontinence, and stress incontinence.  She wears 2 pads daily and they can be damp.  She is minimally bothered by this.  In-office UA today positive for trace intact blood, 3+ protein, and trace leukocytes; urine microscopy with 11-30 WBCs/HPF, 11-30 RBCs/HPF, and moderate bacteria.   PMH: Past Medical History:  Diagnosis Date   Back pain    Cholelithiasis    COPD (chronic obstructive pulmonary disease) (HCC)    Gallstones    GERD (gastroesophageal reflux disease)    Hammertoe    Hepatitis    Hyperlipidemia    Kidney stones 1970   Macular degeneration    MVP (mitral valve prolapse)    Nephrolithiasis    Ovarian cyst 2015   Ovarian cyst     Surgical History: Past Surgical History:  Procedure Laterality Date   AUGMENTATION MAMMAPLASTY Bilateral    bilateral implants   BREAST SURGERY     implants   colonscopy  2012   Unremarkable-Dr Mechele Collin   ESOPHAGOGASTRODUODENOSCOPY (EGD) WITH PROPOFOL N/A 06/18/2015   Procedure: ESOPHAGOGASTRODUODENOSCOPY (EGD) WITH PROPOFOL;  Surgeon: Scot Jun, MD;  Location: [facility] ENDOSCOPY;  Service: Endoscopy;  Laterality: N/A;   ESOPHAGOGASTRODUODENOSCOPY (EGD) WITH PROPOFOL N/A 10/16/2021   Procedure: ESOPHAGOGASTRODUODENOSCOPY (EGD) WITH PROPOFOL;  Surgeon: Toney Reil, MD;  Location: [facility] ENDOSCOPY;  Service: Gastroenterology;  Laterality: N/A;   SAVORY DILATION N/A 06/18/2015   Procedure: SAVORY DILATION;  Surgeon: Scot Jun, MD;  Location: [facility]  ENDOSCOPY;  Service: Endoscopy;  Laterality: N/A;    Home Medications:  Allergies as of 10/11/2023       Reactions   Augmentin [amoxicillin-pot Clavulanate] Other (See Comments)   Elevated Liver Enzymes        Medication List        Accurate as of October 11, 2023  1:38 PM. If you have any questions, ask your nurse or doctor.          ascorbic acid 500 MG tablet Commonly known as: VITAMIN C Take 500 mg by mouth daily.   aspirin EC 81 MG tablet Take 81 mg by mouth daily.   docusate sodium 100 MG capsule Commonly known as: COLACE Take 100 mg by mouth 2 (two) times daily.   ferrous sulfate 325 (65 FE) MG EC tablet Take 325 mg by mouth 3 (three) times daily with meals.   Fish Oil 1000 MG Caps Take 1 capsule by mouth daily.   gabapentin 300 MG capsule Commonly known as: NEURONTIN Take 1 capsule by mouth at bedtime.   LORazepam 0.5 MG tablet Commonly known as: ATIVAN Take 1 tablet by mouth every 8 (eight) hours as needed.   omeprazole 40 MG capsule Commonly known as: PRILOSEC Take 1 capsule (40 mg total) by mouth daily.   OS-CAL PO Take 1 tablet by mouth daily.   PRESERVISION AREDS PO Take 1 tablet by mouth daily.   trimethoprim 100 MG tablet Commonly known as: TRIMPEX Take 1 tablet (100 mg total) by mouth daily.  Allergies:  Allergies  Allergen Reactions   Augmentin [Amoxicillin-Pot Clavulanate] Other (See Comments)    Elevated Liver Enzymes    Family History: Family History  Problem Relation Age of Onset   Cancer Mother    Cholelithiasis Father    Tuberculosis Father    Cancer Father        Prostate and Larynx   Cholelithiasis Sister    Breast cancer Maternal Aunt     Social History:   reports that she has never smoked. She has never used smokeless tobacco. She reports current alcohol use of about 1.0 standard drink of alcohol per week. She reports that she does not use drugs.  Physical Exam: Ht 5' (1.524 m)   Wt 81 lb (36.7  kg)   BMI 15.82 kg/m   Constitutional:  Alert and oriented, no acute distress, nontoxic appearing HEENT: [place], AT Cardiovascular: No clubbing, cyanosis, or edema Respiratory: Normal respiratory effort, no increased work of breathing Skin: No rashes, bruises or suspicious lesions Neurologic: Grossly intact, no focal deficits, moving all 4 extremities Psychiatric: Normal mood and affect  Laboratory Data: Results for orders placed or performed in visit on 10/11/23  Microscopic Examination   Urine  Result Value Ref Range   WBC, UA 11-30 (A) 0 - 5 /hpf   RBC, Urine 11-30 (A) 0 - 2 /hpf   Epithelial Cells (non renal) 0-10 0 - 10 /hpf   Mucus, UA Present (A) Not Estab.   Bacteria, UA Moderate (A) None seen/Few  Urinalysis, Complete  Result Value Ref Range   Specific Gravity, UA >1.030 (H) 1.005 - 1.030   pH, UA 5.5 5.0 - 7.5   Color, UA Yellow Yellow   Appearance Ur Hazy (A) Clear   Leukocytes,UA Trace (A) Negative   Protein,UA 3+ (A) Negative/Trace   Glucose, UA Negative Negative   Ketones, UA Negative Negative   RBC, UA Trace (A) Negative   Bilirubin, UA Negative Negative   Urobilinogen, Ur 0.2 0.2 - 1.0 mg/dL   Nitrite, UA Negative Negative   Microscopic Examination See below:    Assessment & Plan:   1. Recurrent UTI UA today is suspicious, however I suspect this may be a contaminated urine specimen.  Will send for culture and consider treatment based on results.  I would like to get cath UAs on her in the future if she is agreeable.  With a lengthy conversation about continuing daily suppressive therapy versus discontinuation.  Using shared decision making, we decided to continue for at least 6 months.  I will see her back in 4 months for a symptom recheck after she has been off daily antibiotics for about 4 weeks. - Urinalysis, Complete - CULTURE, URINE COMPREHENSIVE  2. Overactive bladder OAB wet with mixed urge and stress incontinence.  We discussed consideration of  pharmacotherapy, however she is minimally bothered by this so she prefers to defer it.  Will continue to monitor.  Return in about 4 months (around 02/09/2024) for rUTI follow up with UA (cath preferred).  Carman Ching, PA-C  [facility] [place] [address], [address] [place], [place] [address] [phone]

## 2023-10-14 LAB — CULTURE, URINE COMPREHENSIVE

## 2023-11-27 ENCOUNTER — Other Ambulatory Visit: Payer: Self-pay | Admitting: Internal Medicine

## 2023-11-27 DIAGNOSIS — Z1231 Encounter for screening mammogram for malignant neoplasm of breast: Secondary | ICD-10-CM

## 2023-12-05 ENCOUNTER — Other Ambulatory Visit: Payer: Self-pay | Admitting: Internal Medicine

## 2023-12-05 ENCOUNTER — Ambulatory Visit
Admission: RE | Admit: 2023-12-05 | Discharge: 2023-12-05 | Disposition: A | Discharge status: Home / Self Care | Payer: Medicare Other | Source: Ambulatory Visit | Attending: Internal Medicine | Admitting: Internal Medicine

## 2023-12-05 DIAGNOSIS — Z1231 Encounter for screening mammogram for malignant neoplasm of breast: Secondary | ICD-10-CM

## 2024-02-11 ENCOUNTER — Ambulatory Visit: Payer: Self-pay | Admitting: Physician Assistant

## 2024-03-10 ENCOUNTER — Ambulatory Visit: Admitting: Physician Assistant

## 2024-03-10 ENCOUNTER — Encounter: Payer: Self-pay | Admitting: Physician Assistant

## 2024-03-10 VITALS — BP 122/78 | HR 67 | Ht 60.0 in | Wt 80.0 lb

## 2024-03-10 DIAGNOSIS — R8271 Bacteriuria: Secondary | ICD-10-CM | POA: Diagnosis not present

## 2024-03-10 DIAGNOSIS — N390 Urinary tract infection, site not specified: Secondary | ICD-10-CM | POA: Diagnosis not present

## 2024-03-10 DIAGNOSIS — N3281 Overactive bladder: Secondary | ICD-10-CM | POA: Diagnosis not present

## 2024-03-10 LAB — MICROSCOPIC EXAMINATION: WBC, UA: >30 /HPF — AB (ref 0–5)

## 2024-03-10 LAB — URINALYSIS, COMPLETE
Bilirubin, UA: NEGATIVE
Glucose, UA: NEGATIVE
Nitrite, UA: POSITIVE — AB
RBC, UA: NEGATIVE
Specific Gravity, UA: 1.015 (ref 1.005–1.030)
Urobilinogen, Ur: 1 mg/dL (ref 0.2–1.0)
pH, UA: 7 (ref 5.0–7.5)

## 2024-03-10 NOTE — Progress Notes (Unsigned)
 03/10/2024 10:38 AM   Lindsey Burke Lindsey Burke 27-Jun-1934 [id number]  CC: Chief Complaint  Patient presents with   Follow-up   HPI: Lindsey Burke is a 89 y.o. female with PMH recurrent UTI previously on suppressive trimethoprim  and OAB wet with mixed urge and stress incontinence who presents today for follow-up after completing suppressive antibiotics.   Today she reports ***  In-office catheterized UA today positive for ***; urine microscopy with *** WBCs/HPF, *** RBCs/HPF, and ***.  PMH: Past Medical History:  Diagnosis Date   Back pain    Cholelithiasis    COPD (chronic obstructive pulmonary disease) (HCC)    Gallstones    GERD (gastroesophageal reflux disease)    Hammertoe    Hepatitis    Hyperlipidemia    Kidney stones 1970   Macular degeneration    MVP (mitral valve prolapse)    Nephrolithiasis    Ovarian cyst 2015   Ovarian cyst     Surgical History: Past Surgical History:  Procedure Laterality Date   AUGMENTATION MAMMAPLASTY Bilateral    bilateral implants   BREAST SURGERY     implants   colonscopy  11/06/2010   Unremarkable-Dr Felicita Horns   ESOPHAGOGASTRODUODENOSCOPY (EGD) WITH PROPOFOL  N/A 06/18/2015   Procedure: ESOPHAGOGASTRODUODENOSCOPY (EGD) WITH PROPOFOL ;  Surgeon: Cassie Click, MD;  Location[facility] ENDOSCOPY;  Service: Endoscopy;  Laterality: N/A;   ESOPHAGOGASTRODUODENOSCOPY (EGD) WITH PROPOFOL  N/A 10/16/2021   Procedure: ESOPHAGOGASTRODUODENOSCOPY (EGD) WITH PROPOFOL ;  Surgeon: Selena Daily, MD;  Location: [facility] ENDOSCOPY;  Service: Gastroenterology;  Laterality: N/A;   SAVORY DILATION N/A 06/18/2015   Procedure: SAVORY DILATION;  Surgeon: Cassie Click, MD;  Location: [facility] ENDOSCOPY;  Service: Endoscopy;  Laterality: N/A;    Home Medications:  Allergies as of 03/10/2024       Reactions   Augmentin [amoxicillin-pot Clavulanate] Other (See Comments)   Elevated Liver Enzymes        Medication List        Accurate as of Mar 10, 2024 10:38 AM. If you have any questions, ask your nurse or doctor.          STOP taking these medications    ferrous sulfate  325 (65 FE) MG EC tablet Stopped by: Asad Keeven   OS-CAL PO Stopped by: Kathreen Pare   trimethoprim  100 MG tablet Commonly known as: TRIMPEX  Stopped by: Kathreen Pare       TAKE these medications    ascorbic acid  500 MG tablet Commonly known as: VITAMIN C Take 500 mg by mouth daily.   aspirin  EC 81 MG tablet Take 81 mg by mouth daily.   cyanocobalamin 1000 MCG tablet Commonly known as: VITAMIN B12 Take 1,000 mcg by mouth.   docusate sodium  100 MG capsule Commonly known as: COLACE Take 100 mg by mouth 2 (two) times daily.   Fish Oil 1000 MG Caps Take 1 capsule by mouth daily.   gabapentin  300 MG capsule Commonly known as: NEURONTIN  Take 1 capsule by mouth at bedtime.   LORazepam  0.5 MG tablet Commonly known as: ATIVAN  Take 1 tablet by mouth every 8 (eight) hours as needed.   omeprazole  40 MG capsule Commonly known as: PRILOSEC Take 1 capsule (40 mg total) by mouth daily.   PRESERVISION AREDS PO Take 1 tablet by mouth daily.        Allergies:  Allergies  Allergen Reactions   Augmentin [Amoxicillin-Pot Clavulanate] Other (See Comments)    Elevated Liver Enzymes    Family History: Family History  Problem  Relation Age of Onset   Cancer Mother    Cholelithiasis Father    Tuberculosis Father    Cancer Father        Prostate and Larynx   Cholelithiasis Sister    Breast cancer Maternal Aunt     Social History:   reports that she has never smoked. She has never used smokeless tobacco. She reports current alcohol use of about 1.0 standard drink of alcohol per week. She reports that she does not use drugs.  Physical Exam: BP 122/78   Pulse 67   Ht 5' (1.524 m)   Wt 80 lb (36.3 kg)   BMI 15.62 kg/m   Constitutional:  Alert and oriented, no acute distress, nontoxic appearing HEENT: [place],  AT Cardiovascular: No clubbing, cyanosis, or edema Respiratory: Normal respiratory effort, no increased work of breathing GI: Abdomen is soft, nontender, nondistended, no abdominal masses GU: No CVA tenderness Lymph: No cervical or inguinal lymphadenopathy Skin: No rashes, bruises or suspicious lesions Neurologic: Grossly intact, no focal deficits, moving all 4 extremities Psychiatric: Normal mood and affect  Laboratory Data: Lab Results  Component Value Date   WBC 7.7 04/25/2022   HGB 11.9 (L) 04/25/2022   HCT 37.5 04/25/2022   MCV 94.7 04/25/2022   PLT 205 04/25/2022    Lab Results  Component Value Date   CREATININE 0.61 04/26/2022    CrCl cannot be calculated (Patient's most recent lab result is older than the maximum 21 days allowed.).  Results for orders placed or performed in visit on 10/11/23  Microscopic Examination   Collection Time: 10/11/23  1:10 PM   Urine  Result Value Ref Range   WBC, UA 11-30 (A) 0 - 5 /hpf   RBC, Urine 11-30 (A) 0 - 2 /hpf   Epithelial Cells (non renal) 0-10 0 - 10 /hpf   Mucus, UA Present (A) Not Estab.   Bacteria, UA Moderate (A) None seen/Few  Urinalysis, Complete   Collection Time: 10/11/23  1:10 PM  Result Value Ref Range   Specific Gravity, UA >1.030 (H) 1.005 - 1.030   pH, UA 5.5 5.0 - 7.5   Color, UA Yellow Yellow   Appearance Ur Hazy (A) Clear   Leukocytes,UA Trace (A) Negative   Protein,UA 3+ (A) Negative/Trace   Glucose, UA Negative Negative   Ketones, UA Negative Negative   RBC, UA Trace (A) Negative   Bilirubin, UA Negative Negative   Urobilinogen, Ur 0.2 0.2 - 1.0 mg/dL   Nitrite, UA Negative Negative   Microscopic Examination See below:   CULTURE, URINE COMPREHENSIVE   Collection Time: 10/11/23  1:44 PM   Specimen: Urine   UR  Result Value Ref Range   Urine Culture, Comprehensive Final report    Organism ID, Bacteria Comment     Pertinent Imaging: KUB, ***: *** No results found for this or any previous  visit.  No results found for this or any previous visit.  No results found for this or any previous visit.  No results found for this or any previous visit.  Results for orders placed during the hospital encounter of 07/25/23  Ultrasound renal complete  Narrative CLINICAL DATA:  Acute cystitis without hematuria.  EXAM: RENAL / URINARY TRACT ULTRASOUND COMPLETE  COMPARISON:  CT abdomen pelvis April 25, 2022, limited abdomen ultrasound April 29, 2015  FINDINGS: Right Kidney:  Renal measurements: 9.3 x 3 x 4.5 cm = volume: 65.1 mL. Echogenicity within normal limits. No hydronephrosis visualized. There is a 3.9 x  1.8 x 2.4 cm simple cyst lower pole. No follow-up is recommended.  Left Kidney:  Renal measurements: 8.8 x 4.8 x 4.3 cm = volume: 93.3 mL. Echogenicity within normal limits. No mass or hydronephrosis visualized.  Bladder:  Appears normal for degree of bladder distention. Bilateral ureteral jets noted.  Other:  None.  IMPRESSION: Normal renal ultrasound.   Electronically Signed By: Anna Barnes M.D. On: 07/25/2023 09:12  No results found for this or any previous visit.  No results found for this or any previous visit.  Results for orders placed during the hospital encounter of 04/25/22  CT Renal Stone Study  Narrative CLINICAL DATA:  Flank pain, kidney stone suspected.  EXAM: CT ABDOMEN AND PELVIS WITHOUT CONTRAST  TECHNIQUE: Multidetector CT imaging of the abdomen and pelvis was performed following the standard protocol without IV contrast.  RADIATION DOSE REDUCTION: This exam was performed according to the departmental dose-optimization program which includes automated exposure control, adjustment of the mA and/or kV according to patient size and/or use of iterative reconstruction technique.  COMPARISON:  CT examination dated May 20, 2014  FINDINGS: Lower chest: No acute abnormality.  Mild bibasilar fibrotic changes.  Hepatobiliary:  No focal liver abnormality is seen. Multiple dependent gallstones without gallbladder wall thickening, or biliary dilatation.  Pancreas: Unremarkable. No pancreatic ductal dilatation or surrounding inflammatory changes.  Spleen: Normal in size without focal abnormality.  Adrenals/Urinary Tract: Adrenal glands are unremarkable. Multiple right renal calculi for example a 4 mm calculus in the midpole and two other midpole calculi measuring 3 and 2 mm. No hydronephrosis or ureteral calculus identified. Simple exophytic cyst in the lower pole of the right kidney, unchanged, no further imaging follow-up is recommended. Left kidney and ureter are unremarkable. Bladder is unremarkable.  Stomach/Bowel: Stomach is within normal limits. Appendix not identified. Moderate amount of stool in the colon. No evidence of bowel wall thickening, distention, or inflammatory changes.  Vascular/Lymphatic: Aortic atherosclerosis. No enlarged abdominal or pelvic lymph nodes.  Reproductive: Uterus and bilateral adnexa are unremarkable.  Other: No abdominal wall hernia or abnormality. No abdominopelvic ascites.  Musculoskeletal: Hypodense lesion in the L3 vertebral body, unchanged from 2015, most consistent with a hemangioma. Degenerate disc disease of the lumbar spine. No acute fracture.  IMPRESSION: 1.  Cholelithiasis without evidence of acute cholecystitis.  2. Multiple right renal calculi the largest measuring up to 4 mm without evidence of hydronephrosis or ureteral calculus.  3. Moderate amount of retained colonic stool suggesting constipation. Bowel loops are normal in caliber. No evidence of colitis or diverticulitis.  4.  No CT evidence of acute abdominal/pelvic process.   Electronically Signed By: Imran  Ahmed D.O. On: 04/25/2022 10:05   I personally reviewed the images referenced above and note ***.  Assessment & Plan:   There are no diagnoses linked to this encounter.  No  follow-ups on file.  Kathreen Pare, PA-C  [facility] [place] [address], [address] [place], [place] [address] [phone]

## 2024-03-25 ENCOUNTER — Other Ambulatory Visit: Payer: Self-pay

## 2024-03-25 ENCOUNTER — Inpatient Hospital Stay
Admission: EM | Admit: 2024-03-25 | Discharge: 2024-03-29 | DRG: 871 | Disposition: A | Discharge status: Home Health | Attending: Hospitalist | Admitting: Hospitalist

## 2024-03-25 DIAGNOSIS — N189 Chronic kidney disease, unspecified: Secondary | ICD-10-CM | POA: Diagnosis present

## 2024-03-25 DIAGNOSIS — E785 Hyperlipidemia, unspecified: Secondary | ICD-10-CM | POA: Diagnosis present

## 2024-03-25 DIAGNOSIS — Z7982 Long term (current) use of aspirin: Secondary | ICD-10-CM

## 2024-03-25 DIAGNOSIS — W19XXXA Unspecified fall, initial encounter: Secondary | ICD-10-CM | POA: Diagnosis present

## 2024-03-25 DIAGNOSIS — Z1152 Encounter for screening for COVID-19: Secondary | ICD-10-CM

## 2024-03-25 DIAGNOSIS — J449 Chronic obstructive pulmonary disease, unspecified: Secondary | ICD-10-CM | POA: Diagnosis present

## 2024-03-25 DIAGNOSIS — N179 Acute kidney failure, unspecified: Secondary | ICD-10-CM | POA: Diagnosis present

## 2024-03-25 DIAGNOSIS — M6282 Rhabdomyolysis: Secondary | ICD-10-CM | POA: Diagnosis present

## 2024-03-25 DIAGNOSIS — I21A1 Myocardial infarction type 2: Secondary | ICD-10-CM | POA: Diagnosis present

## 2024-03-25 DIAGNOSIS — Z87442 Personal history of urinary calculi: Secondary | ICD-10-CM

## 2024-03-25 DIAGNOSIS — D696 Thrombocytopenia, unspecified: Secondary | ICD-10-CM | POA: Diagnosis present

## 2024-03-25 DIAGNOSIS — Z831 Family history of other infectious and parasitic diseases: Secondary | ICD-10-CM

## 2024-03-25 DIAGNOSIS — K7200 Acute and subacute hepatic failure without coma: Secondary | ICD-10-CM | POA: Diagnosis present

## 2024-03-25 DIAGNOSIS — R531 Weakness: Principal | ICD-10-CM

## 2024-03-25 DIAGNOSIS — R6521 Severe sepsis with septic shock: Secondary | ICD-10-CM | POA: Diagnosis not present

## 2024-03-25 DIAGNOSIS — R64 Cachexia: Secondary | ICD-10-CM | POA: Diagnosis present

## 2024-03-25 DIAGNOSIS — A419 Sepsis, unspecified organism: Secondary | ICD-10-CM | POA: Diagnosis not present

## 2024-03-25 DIAGNOSIS — Z79899 Other long term (current) drug therapy: Secondary | ICD-10-CM

## 2024-03-25 DIAGNOSIS — B9620 Unspecified Escherichia coli [E. coli] as the cause of diseases classified elsewhere: Secondary | ICD-10-CM | POA: Diagnosis present

## 2024-03-25 DIAGNOSIS — K8020 Calculus of gallbladder without cholecystitis without obstruction: Secondary | ICD-10-CM | POA: Diagnosis present

## 2024-03-25 DIAGNOSIS — Z880 Allergy status to penicillin: Secondary | ICD-10-CM

## 2024-03-25 DIAGNOSIS — Z681 Body mass index (BMI) 19 or less, adult: Secondary | ICD-10-CM

## 2024-03-25 DIAGNOSIS — K219 Gastro-esophageal reflux disease without esophagitis: Secondary | ICD-10-CM | POA: Diagnosis present

## 2024-03-25 DIAGNOSIS — Z803 Family history of malignant neoplasm of breast: Secondary | ICD-10-CM

## 2024-03-25 DIAGNOSIS — E162 Hypoglycemia, unspecified: Secondary | ICD-10-CM | POA: Diagnosis present

## 2024-03-25 DIAGNOSIS — I341 Nonrheumatic mitral (valve) prolapse: Secondary | ICD-10-CM | POA: Diagnosis present

## 2024-03-25 DIAGNOSIS — E872 Acidosis, unspecified: Secondary | ICD-10-CM | POA: Diagnosis present

## 2024-03-25 DIAGNOSIS — N390 Urinary tract infection, site not specified: Secondary | ICD-10-CM | POA: Diagnosis present

## 2024-03-25 DIAGNOSIS — E860 Dehydration: Secondary | ICD-10-CM | POA: Diagnosis present

## 2024-03-25 DIAGNOSIS — R571 Hypovolemic shock: Secondary | ICD-10-CM | POA: Diagnosis present

## 2024-03-25 DIAGNOSIS — I129 Hypertensive chronic kidney disease with stage 1 through stage 4 chronic kidney disease, or unspecified chronic kidney disease: Secondary | ICD-10-CM | POA: Diagnosis present

## 2024-03-25 LAB — COMPREHENSIVE METABOLIC PANEL WITH GFR
ALT: 114 U/L — ABNORMAL HIGH (ref 0–44)
AST: 144 U/L — ABNORMAL HIGH (ref 15–41)
Albumin: 3.6 g/dL (ref 3.5–5.0)
Alkaline Phosphatase: 54 U/L (ref 38–126)
Anion gap: 15 (ref 5–15)
BUN: 44 mg/dL — ABNORMAL HIGH (ref 8–23)
CO2: 25 mmol/L (ref 22–32)
Calcium: 9.4 mg/dL (ref 8.9–10.3)
Chloride: 105 mmol/L (ref 98–111)
Creatinine, Ser: 2.58 mg/dL — ABNORMAL HIGH (ref 0.44–1.00)
GFR, Estimated: 17 mL/min — ABNORMAL LOW (ref >60)
Glucose, Bld: 91 mg/dL (ref 70–99)
Potassium: 4.3 mmol/L (ref 3.5–5.1)
Sodium: 145 mmol/L (ref 135–145)
Total Bilirubin: 0.6 mg/dL (ref 0.0–1.2)
Total Protein: 6.6 g/dL (ref 6.5–8.1)

## 2024-03-25 LAB — CBC
HCT: 40 % (ref 36.0–46.0)
Hemoglobin: 12.6 g/dL (ref 12.0–15.0)
MCH: 30.1 pg (ref 26.0–34.0)
MCHC: 31.5 g/dL (ref 30.0–36.0)
MCV: 95.5 fL (ref 80.0–100.0)
Platelets: 123 10*3/uL — ABNORMAL LOW (ref 150–400)
RBC: 4.19 MIL/uL (ref 3.87–5.11)
RDW: 13 % (ref 11.5–15.5)
WBC: 24.7 10*3/uL — ABNORMAL HIGH (ref 4.0–10.5)
nRBC: 0 % (ref 0.0–0.2)

## 2024-03-25 LAB — CK: Total CK: 982 U/L — ABNORMAL HIGH (ref 38–234)

## 2024-03-25 MED ORDER — SODIUM CHLORIDE 0.9 % IV SOLN
2.0000 g | Freq: Once | INTRAVENOUS | Status: AC
  Administered 2024-03-26: 2 g via INTRAVENOUS
  Filled 2024-03-25: qty 12.5

## 2024-03-25 MED ORDER — LACTATED RINGERS IV BOLUS
1000.0000 mL | Freq: Once | INTRAVENOUS | Status: AC
  Administered 2024-03-25: 1000 mL via INTRAVENOUS

## 2024-03-25 MED ORDER — VANCOMYCIN HCL 750 MG/150ML IV SOLN
750.0000 mg | Freq: Once | INTRAVENOUS | Status: AC
  Administered 2024-03-26: 750 mg via INTRAVENOUS
  Filled 2024-03-25: qty 150

## 2024-03-25 NOTE — Group Note (Deleted)
 Date:  03/25/2024 Time:  9:45 PM  Group Topic/Focus:  Wrap-Up Group:   The focus of this group is to help patients review their daily goal of treatment and discuss progress on daily workbooks.     Participation Level:  {BHH PARTICIPATION EAVWU:98119}  Participation Quality:  {BHH PARTICIPATION QUALITY:22265}  Affect:  {BHH AFFECT:22266}  Cognitive:  {BHH COGNITIVE:22267}  Insight: {BHH Insight2:20797}  Engagement in Group:  {BHH ENGAGEMENT IN JYNWG:95621}  Modes of Intervention:  {BHH MODES OF INTERVENTION:22269}  Additional Comments:  ***  Maglione,Preciosa Bundrick E 03/25/2024, 9:45 PM

## 2024-03-25 NOTE — ED Notes (Signed)
 Pt placed on cardiac monitor at this time. Call light within reach. Dtr at bedside. Fall risk bracelet placed on pt left wrist.

## 2024-03-25 NOTE — ED Notes (Signed)
 ED Provider at bedside.

## 2024-03-25 NOTE — ED Provider Notes (Signed)
 [facility] Provider Note   Event Date/Time   First MD Initiated Contact with Patient 03/25/24 2128     (approximate) History  Weakness  HPI Lindsey Burke is a 89 y.o. female with a past medical history of hyperlipidemia who presents after her daughter reports finding patient on the floor around 5 PM today.  Patient states she went to the bathroom around 4 AM and had a fall but was too weak to get back up.  Patient denies any trauma after the fall including no head injury or loss of consciousness.  Patient reports generalized weakness over the past couple weeks that has been getting worse over that time.  Patient also believes that she has a urinary tract infection as she has had recurrent urinary tract infections in the past ROS: Patient currently denies any vision changes, tinnitus, difficulty speaking, facial droop, sore throat, chest pain, shortness of breath, abdominal pain, nausea/vomiting/diarrhea, or weakness/numbness/paresthesias in any extremity   Physical Exam  Triage Vital Signs: ED Triage Vitals  Encounter Vitals Group     BP 03/25/24 2011 (!) 104/52     Systolic BP Percentile --      Diastolic BP Percentile --      Pulse Rate 03/25/24 2011 93     Resp 03/25/24 2011 18     Temp 03/25/24 2011 98.4 F (36.9 C)     Temp src --      SpO2 03/25/24 2011 98 %     Weight 03/25/24 2008 80 lb (36.3 kg)     Height 03/25/24 2008 5' (1.524 m)     Head Circumference --      Peak Flow --      Pain Score 03/25/24 2008 0     Pain Loc --      Pain Education --      Exclude from Growth Chart --    Most recent vital signs: Vitals:   03/25/24 2011  BP: (!) 104/52  Pulse: 93  Resp: 18  Temp: 98.4 F (36.9 C)  SpO2: 98%   General: Awake, oriented x4. CV:  Good peripheral perfusion.  Resp:  Normal effort.  Abd:  No distention.  Other:  Well-developed, well-nourished elderly Caucasian female resting comfortably in no acute distress ED Results /  Procedures / Treatments  Labs (all labs ordered are listed, but only abnormal results are displayed) Labs Reviewed  COMPREHENSIVE METABOLIC PANEL WITH GFR - Abnormal; Notable for the following components:      Result Value   BUN 44 (*)    Creatinine, Ser 2.58 (*)    AST 144 (*)    ALT 114 (*)    GFR, Estimated 17 (*)    All other components within normal limits  CBC - Abnormal; Notable for the following components:   WBC 24.7 (*)    Platelets 123 (*)    All other components within normal limits  CK - Abnormal; Notable for the following components:   Total CK 982 (*)    All other components within normal limits  URINALYSIS, ROUTINE W REFLEX MICROSCOPIC   EKG ED ECG REPORT I, Charleen Conn, the attending physician, personally viewed and interpreted this ECG. Date: 03/25/2024 EKG Time: 2018 Rate: 89 Rhythm: normal sinus rhythm QRS Axis: normal Intervals: normal ST/T Wave abnormalities: normal Narrative Interpretation: no evidence of acute ischemia PROCEDURES: Critical Care performed: No Procedures MEDICATIONS ORDERED IN ED: Medications  lactated ringers  bolus 1,000 mL (1,000 mLs Intravenous New Bag/Given  03/25/24 2153)   IMPRESSION / MDM / ASSESSMENT AND PLAN / ED COURSE  I reviewed the triage vital signs and the nursing notes.                             The patient is on the cardiac monitor to evaluate for evidence of arrhythmia and/or significant heart rate changes. Patient's presentation is most consistent with acute presentation with potential threat to life or bodily function. CBC, CMP, CK, UA, and EKG. Creatinine 2.58 up from baseline of 0.61 1-year ago EKG today shows no overt evidence of STEMI.  CK elevated  Given history, exam, and workup I have low suspicion for myositis, fracture/dislocation or other emergent problems as a cause for this presentation.  Presentation most consistent with exercise-induced rhabdomyolysis.  ED interventions: Copious IV  hydration  Dispo: Admit for continued hydration and continued monitoring of renal function.  Urine input output as well as repeat labs.  Patient without life-threatening electrolyte abnormality, cardiac arrhythmia at this time   FINAL CLINICAL IMPRESSION(S) / ED DIAGNOSES   Final diagnoses:  Weakness  Dehydration  Non-traumatic rhabdomyolysis  AKI (acute kidney injury) (HCC)   Rx / DC Orders   ED Discharge Orders     None      Note:  This document was prepared using Dragon voice recognition software and may include unintentional dictation errors.   Emeril Stille K, MD 03/25/24 2252

## 2024-03-25 NOTE — ED Provider Notes (Addendum)
 Received in signout. Patient with rhabdomyolysis, and acute kidney injury, hypotensive and getting fluids.  Daughter states that the patient has recurrent urinary tract infections and had a suspected early urinary tract infection diagnosed recently but not put on antibiotics as outpt.  Urinalysis being obtained shortly by In-N-Out catheter.  --  Placed on broad-spectrum antibiotics.  Will recheck blood pressures after initial fluid resuscitation to determine level of care for admission.  Initial sepsis labs had not been ordered, added on at this time, patient's blood pressure getting better.  Anticipate admission to hospital floor.  She is about 2-1/2 L in for her 36 kg self which is a good deal of fluids per body weight.  She still hypertensive in the 80s systolic with a MAP below 65 started her on Levophed and consulted ICU for admission.    PROCEDURES:  Critical Care performed: Yes, see critical care procedure note(s)  .Critical Care  Performed by: Buell Carmin, MD Authorized by: Buell Carmin, MD   Critical care provider statement:    Critical care time (minutes):  30   Critical care was time spent personally by me on the following activities:  Development of treatment plan with patient or surrogate, discussions with consultants, evaluation of patient's response to treatment, examination of patient, ordering and review of laboratory studies, ordering and review of radiographic studies, ordering and performing treatments and interventions, pulse oximetry, re-evaluation of patient's condition and review of old charts       Buell Carmin, MD 03/25/24 2319    Buell Carmin, MD 03/26/24 0454    Buell Carmin, MD 03/26/24 [phone]

## 2024-03-25 NOTE — Progress Notes (Signed)
 ED Pharmacy Antibiotic Sign Off An antibiotic consult was received from an ED provider for Vancomycin per pharmacy dosing for sepsis. A chart review was completed to assess appropriateness.   The following one time order(s) were placed:  Vancomycin 750 mg IV X 1 .   Further antibiotic and/or antibiotic pharmacy consults should be ordered by the admitting provider if indicated.   Thank you for allowing pharmacy to be a part of this patient's care.   Alvenia Job [facility]  Clinical Pharmacist 03/25/24 11:21 PM

## 2024-03-25 NOTE — ED Triage Notes (Signed)
 Pt to ED via POV c.o weakness. Pts daughter reports finding pt on the floor around 5pm today. Pt states she went to the bathroom around 4am today and was too weak to get back into bed. Pt reports weakness for about a couple of weeks that has been getting progressively worse. Daughter believes pt has UTI, pt has hx of them and is prone to UTIs. Pt denies any pain.

## 2024-03-26 ENCOUNTER — Inpatient Hospital Stay
Admit: 2024-03-26 | Discharge: 2024-03-26 | Disposition: A | Discharge status: Home / Self Care | Attending: Pulmonary Disease | Admitting: Pulmonary Disease

## 2024-03-26 ENCOUNTER — Inpatient Hospital Stay

## 2024-03-26 DIAGNOSIS — E785 Hyperlipidemia, unspecified: Secondary | ICD-10-CM | POA: Diagnosis present

## 2024-03-26 DIAGNOSIS — D696 Thrombocytopenia, unspecified: Secondary | ICD-10-CM | POA: Diagnosis present

## 2024-03-26 DIAGNOSIS — Z803 Family history of malignant neoplasm of breast: Secondary | ICD-10-CM | POA: Diagnosis not present

## 2024-03-26 DIAGNOSIS — N189 Chronic kidney disease, unspecified: Secondary | ICD-10-CM | POA: Diagnosis present

## 2024-03-26 DIAGNOSIS — Z7982 Long term (current) use of aspirin: Secondary | ICD-10-CM | POA: Diagnosis not present

## 2024-03-26 DIAGNOSIS — I21A1 Myocardial infarction type 2: Secondary | ICD-10-CM | POA: Diagnosis present

## 2024-03-26 DIAGNOSIS — W19XXXA Unspecified fall, initial encounter: Secondary | ICD-10-CM | POA: Diagnosis present

## 2024-03-26 DIAGNOSIS — R6521 Severe sepsis with septic shock: Secondary | ICD-10-CM | POA: Diagnosis present

## 2024-03-26 DIAGNOSIS — M6282 Rhabdomyolysis: Secondary | ICD-10-CM | POA: Diagnosis present

## 2024-03-26 DIAGNOSIS — R571 Hypovolemic shock: Secondary | ICD-10-CM | POA: Diagnosis present

## 2024-03-26 DIAGNOSIS — E872 Acidosis, unspecified: Secondary | ICD-10-CM | POA: Diagnosis present

## 2024-03-26 DIAGNOSIS — B9620 Unspecified Escherichia coli [E. coli] as the cause of diseases classified elsewhere: Secondary | ICD-10-CM | POA: Diagnosis present

## 2024-03-26 DIAGNOSIS — I129 Hypertensive chronic kidney disease with stage 1 through stage 4 chronic kidney disease, or unspecified chronic kidney disease: Secondary | ICD-10-CM | POA: Diagnosis present

## 2024-03-26 DIAGNOSIS — E860 Dehydration: Secondary | ICD-10-CM | POA: Diagnosis present

## 2024-03-26 DIAGNOSIS — N179 Acute kidney failure, unspecified: Secondary | ICD-10-CM | POA: Diagnosis present

## 2024-03-26 DIAGNOSIS — A419 Sepsis, unspecified organism: Secondary | ICD-10-CM | POA: Diagnosis present

## 2024-03-26 DIAGNOSIS — Z681 Body mass index (BMI) 19 or less, adult: Secondary | ICD-10-CM | POA: Diagnosis not present

## 2024-03-26 DIAGNOSIS — Z87442 Personal history of urinary calculi: Secondary | ICD-10-CM | POA: Diagnosis not present

## 2024-03-26 DIAGNOSIS — J449 Chronic obstructive pulmonary disease, unspecified: Secondary | ICD-10-CM | POA: Diagnosis present

## 2024-03-26 DIAGNOSIS — Z1152 Encounter for screening for COVID-19: Secondary | ICD-10-CM | POA: Diagnosis not present

## 2024-03-26 DIAGNOSIS — K7200 Acute and subacute hepatic failure without coma: Secondary | ICD-10-CM | POA: Diagnosis present

## 2024-03-26 DIAGNOSIS — N390 Urinary tract infection, site not specified: Secondary | ICD-10-CM | POA: Diagnosis present

## 2024-03-26 DIAGNOSIS — R64 Cachexia: Secondary | ICD-10-CM | POA: Diagnosis present

## 2024-03-26 DIAGNOSIS — Z831 Family history of other infectious and parasitic diseases: Secondary | ICD-10-CM | POA: Diagnosis not present

## 2024-03-26 DIAGNOSIS — I341 Nonrheumatic mitral (valve) prolapse: Secondary | ICD-10-CM | POA: Diagnosis present

## 2024-03-26 LAB — RENAL FUNCTION PANEL
Albumin: 2.8 g/dL — ABNORMAL LOW (ref 3.5–5.0)
Anion gap: 14 (ref 5–15)
BUN: 48 mg/dL — ABNORMAL HIGH (ref 8–23)
CO2: 22 mmol/L (ref 22–32)
Calcium: 8.3 mg/dL — ABNORMAL LOW (ref 8.9–10.3)
Chloride: 106 mmol/L (ref 98–111)
Creatinine, Ser: 2.28 mg/dL — ABNORMAL HIGH (ref 0.44–1.00)
GFR, Estimated: 20 mL/min — ABNORMAL LOW
Glucose, Bld: 131 mg/dL — ABNORMAL HIGH (ref 70–99)
Phosphorus: 3.6 mg/dL (ref 2.5–4.6)
Potassium: 4.6 mmol/L (ref 3.5–5.1)
Sodium: 142 mmol/L (ref 135–145)

## 2024-03-26 LAB — CBC WITH DIFFERENTIAL/PLATELET
Abs Immature Granulocytes: 2.01 10*3/uL — ABNORMAL HIGH (ref 0.00–0.07)
Basophils Absolute: 0.1 10*3/uL (ref 0.0–0.1)
Basophils Relative: 1 %
Eosinophils Absolute: 0 10*3/uL (ref 0.0–0.5)
Eosinophils Relative: 0 %
HCT: 35.3 % — ABNORMAL LOW (ref 36.0–46.0)
Hemoglobin: 11.4 g/dL — ABNORMAL LOW (ref 12.0–15.0)
Immature Granulocytes: 13 %
Lymphocytes Relative: 4 %
Lymphs Abs: 0.7 10*3/uL (ref 0.7–4.0)
MCH: 30.9 pg (ref 26.0–34.0)
MCHC: 32.3 g/dL (ref 30.0–36.0)
MCV: 95.7 fL (ref 80.0–100.0)
Monocytes Absolute: 0.3 10*3/uL (ref 0.1–1.0)
Monocytes Relative: 2 %
Neutro Abs: 12.9 10*3/uL — ABNORMAL HIGH (ref 1.7–7.7)
Neutrophils Relative %: 80 %
Platelets: 93 10*3/uL — ABNORMAL LOW (ref 150–400)
RBC: 3.69 MIL/uL — ABNORMAL LOW (ref 3.87–5.11)
RDW: 13.2 % (ref 11.5–15.5)
Smear Review: NORMAL
WBC: 16 10*3/uL — ABNORMAL HIGH (ref 4.0–10.5)
nRBC: 0 % (ref 0.0–0.2)

## 2024-03-26 LAB — SARS CORONAVIRUS 2 BY RT PCR: SARS Coronavirus 2 by RT PCR: NEGATIVE

## 2024-03-26 LAB — URINALYSIS, ROUTINE W REFLEX MICROSCOPIC
Bilirubin Urine: NEGATIVE
Glucose, UA: NEGATIVE mg/dL
Ketones, ur: NEGATIVE mg/dL
Nitrite: NEGATIVE
Protein, ur: >=300 mg/dL — AB
RBC / HPF: >50 RBC/hpf (ref 0–5)
Specific Gravity, Urine: 1.019 (ref 1.005–1.030)
Squamous Epithelial / HPF: 0 /HPF (ref 0–5)
WBC, UA: >50 WBC/hpf (ref 0–5)
pH: 5 (ref 5.0–8.0)

## 2024-03-26 LAB — TROPONIN I (HIGH SENSITIVITY)
Troponin I (High Sensitivity): 1012 ng/L
Troponin I (High Sensitivity): 748 ng/L (ref <18)

## 2024-03-26 LAB — GLUCOSE, CAPILLARY
Glucose-Capillary: 116 mg/dL — ABNORMAL HIGH (ref 70–99)
Glucose-Capillary: 40 mg/dL — CL (ref 70–99)
Glucose-Capillary: 44 mg/dL — CL (ref 70–99)

## 2024-03-26 LAB — LACTIC ACID, PLASMA
Lactic Acid, Venous: 5 mmol/L (ref 0.5–1.9)
Lactic Acid, Venous: 3.9 mmol/L (ref 0.5–1.9)
Lactic Acid, Venous: 2.8 mmol/L (ref 0.5–1.9)
Lactic Acid, Venous: 2.9 mmol/L (ref 0.5–1.9)
Lactic Acid, Venous: 2.8 mmol/L (ref 0.5–1.9)
Lactic Acid, Venous: 2.2 mmol/L (ref 0.5–1.9)
Lactic Acid, Venous: 1.6 mmol/L (ref 0.5–1.9)

## 2024-03-26 LAB — TECHNOLOGIST SMEAR REVIEW: Plt Morphology: NORMAL

## 2024-03-26 LAB — CK
Total CK: 750 U/L — ABNORMAL HIGH (ref 38–234)
Total CK: 651 U/L — ABNORMAL HIGH (ref 38–234)

## 2024-03-26 LAB — CREATININE, SERUM
Creatinine, Ser: 2.14 mg/dL — ABNORMAL HIGH (ref 0.44–1.00)
GFR, Estimated: 22 mL/min — ABNORMAL LOW (ref >60)

## 2024-03-26 LAB — MRSA NEXT GEN BY PCR, NASAL: MRSA by PCR Next Gen: NOT DETECTED

## 2024-03-26 MED ORDER — ORAL CARE MOUTH RINSE: 15.0000 mL | OROMUCOSAL | Status: DC | PRN

## 2024-03-26 MED ORDER — SODIUM CHLORIDE 0.9 % IV SOLN
2.0000 g | INTRAVENOUS | Status: DC
  Administered 2024-03-26: 2 g via INTRAVENOUS
  Filled 2024-03-26: qty 12.5

## 2024-03-26 MED ORDER — MIDODRINE HCL 5 MG PO TABS
5.0000 mg | ORAL_TABLET | Freq: Three times a day (TID) | ORAL | Status: DC
  Administered 2024-03-26 – 2024-03-27 (×5): 5 mg via ORAL
  Filled 2024-03-26 (×5): qty 1

## 2024-03-26 MED ORDER — SODIUM CHLORIDE 0.9 % IV SOLN: 250.0000 mL | INTRAVENOUS | Status: AC

## 2024-03-26 MED ORDER — LACTATED RINGERS IV BOLUS
1000.0000 mL | Freq: Once | INTRAVENOUS | Status: AC
  Administered 2024-03-26: 1000 mL via INTRAVENOUS

## 2024-03-26 MED ORDER — DEXTROSE 50 % IV SOLN
INTRAVENOUS | Status: AC
  Filled 2024-03-26: qty 50

## 2024-03-26 MED ORDER — DEXTROSE 50 % IV SOLN
25.0000 g | INTRAVENOUS | Status: AC
  Administered 2024-03-26: 25 g via INTRAVENOUS

## 2024-03-26 MED ORDER — NOREPINEPHRINE 4 MG/250ML-% IV SOLN: 0.0000 ug/min | INTRAVENOUS | Status: DC

## 2024-03-26 MED ORDER — HEPARIN SODIUM (PORCINE) 5000 UNIT/ML IJ SOLN
5000.0000 [IU] | Freq: Three times a day (TID) | INTRAMUSCULAR | Status: DC
  Administered 2024-03-26 – 2024-03-28 (×8): 5000 [IU] via SUBCUTANEOUS
  Filled 2024-03-26 (×9): qty 1

## 2024-03-26 MED ORDER — CHLORHEXIDINE GLUCONATE CLOTH 2 % EX PADS
6.0000 | MEDICATED_PAD | Freq: Every day | CUTANEOUS | Status: DC
  Administered 2024-03-26 – 2024-03-29 (×4): 6 via TOPICAL

## 2024-03-26 MED ORDER — NOREPINEPHRINE 4 MG/250ML-% IV SOLN
0.0000 ug/min | INTRAVENOUS | Status: DC
  Administered 2024-03-26: 2 ug/min via INTRAVENOUS
  Filled 2024-03-26: qty 250

## 2024-03-26 MED ORDER — LACTATED RINGERS IV SOLN: INTRAVENOUS | Status: AC

## 2024-03-26 NOTE — Progress Notes (Signed)
 Attempted to call and get report; no answer when transferred to nurse.

## 2024-03-26 NOTE — ED Notes (Signed)
 INFORMED RN BED ASSIGNED

## 2024-03-26 NOTE — Consult Note (Signed)
 [facility] CARDIOLOGY CONSULT NOTE       Patient ID: Lindsey Burke MRN: [id number] DOB/AGE: 1934-09-03 89 y.o.  Admit date: 03/25/2024 Referring Physician Dr. Jaclynn Mast Primary Physician Westley Hammers, MD Primary Cardiologist Dr. Parks Bollman Reason for Consultation elevated trops  HPI: Lindsey Burke is a 89 y.o. female  with a past medical history of MVP, hyperlipidemia, COPD, CKD who presented to the ED on 03/25/2024 after a fall. Work up of patient revealed elevated troponins in setting of severe sepsis with shock and rhabdo. Cardiology was consulted for further evaluation.   Patient presented to the ED after a fall and was too weak to get back up. Patient remained on the floor for 11 hours. Work up in the ED notable for Na 145, K 4.3, Cr 2.58, Hgb 12.6, plts 123. CXR with no active disease. UA indicating UTI. Lactic acid trending 5 > 3.9 > 2.8. CK 982 > 750 > 651. Troponin 1K > 700. EKG in ED with no acute ischemic changes. Patient started on IVF and IV abx and levo gtt started due to soft BP.  At the time of my evaluation this morning, patient was resting comfortably in hospital bed in no acute distress. Discussed patients sxs in further detail. Patient denies any chest pain, palpitations, SOB, lightheadedness or dizziness prior to fall or during admission. Patient denies any history of coronary artery disease. Denies tobacco or alcohol use.    Review of systems complete and found to be negative unless listed above    Past Medical History:  Diagnosis Date   Back pain    Cholelithiasis    COPD (chronic obstructive pulmonary disease) (HCC)    Gallstones    GERD (gastroesophageal reflux disease)    Hammertoe    Hepatitis    Hyperlipidemia    Kidney stones 1970   Macular degeneration    MVP (mitral valve prolapse)    Nephrolithiasis    Ovarian cyst 2015   Ovarian cyst     Past Surgical History:  Procedure Laterality Date   AUGMENTATION MAMMAPLASTY Bilateral     bilateral implants   BREAST SURGERY     implants   colonscopy  11/06/2010   Unremarkable-Dr Felicita Horns   ESOPHAGOGASTRODUODENOSCOPY (EGD) WITH PROPOFOL  N/A 06/18/2015   Procedure: ESOPHAGOGASTRODUODENOSCOPY (EGD) WITH PROPOFOL ;  Surgeon: Cassie Click, MD;  Location: [facility] ENDOSCOPY;  Service: Endoscopy;  Laterality: N/A;   ESOPHAGOGASTRODUODENOSCOPY (EGD) WITH PROPOFOL  N/A 10/16/2021   Procedure: ESOPHAGOGASTRODUODENOSCOPY (EGD) WITH PROPOFOL ;  Surgeon: Selena Daily, MD;  Location: [facility] ENDOSCOPY;  Service: Gastroenterology;  Laterality: N/A;   SAVORY DILATION N/A 06/18/2015   Procedure: SAVORY DILATION;  Surgeon: Cassie Click, MD;  Location: [facility] ENDOSCOPY;  Service: Endoscopy;  Laterality: N/A;    Medications Prior to Admission  Medication Sig Dispense Refill Last Dose/Taking   aspirin  EC 81 MG tablet Take 81 mg by mouth daily.   03/24/2024   cyanocobalamin (VITAMIN B12) 1000 MCG tablet Take 1,000 mcg by mouth daily.   03/24/2024   docusate sodium  (COLACE) 100 MG capsule Take 100 mg by mouth 2 (two) times daily.   03/24/2024   gabapentin  (NEURONTIN ) 300 MG capsule Take 1 capsule by mouth at bedtime.   03/24/2024   LORazepam  (ATIVAN ) 0.5 MG tablet Take 1 tablet by mouth at bedtime.   03/24/2024   metoprolol  tartrate (LOPRESSOR ) 25 MG tablet Take 25 mg by mouth 2 (two) times daily.   03/24/2024   Multiple Vitamins-Minerals (PRESERVISION AREDS PO) Take 1 tablet  by mouth daily.   03/24/2024   Omega-3 Fatty Acids (FISH OIL) 1000 MG CAPS Take 1 capsule by mouth daily.   03/24/2024   omeprazole  (PRILOSEC) 40 MG capsule Take 1 capsule (40 mg total) by mouth daily. 30 capsule 11 03/24/2024   vitamin C (ASCORBIC ACID ) 500 MG tablet Take 500 mg by mouth daily.   03/24/2024   Social History   Socioeconomic History   Marital status: Widowed    Spouse name: Not on file   Number of children: Not on file   Years of education: Not on file   Highest education level: Not on file  Occupational  History   Not on file  Tobacco Use   Smoking status: Never   Smokeless tobacco: Never  Substance and Sexual Activity   Alcohol use: Yes    Alcohol/week: 1.0 standard drink of alcohol    Types: 1 Glasses of wine per week    Comment: occasional   Drug use: No   Sexual activity: Not on file  Other Topics Concern   Not on file  Social History Narrative   Not on file   Social Drivers of Health   Financial Resource Strain: Not on file  Food Insecurity: No Food Insecurity (03/26/2024)   Hunger Vital Sign    Worried About Running Out of Food in the Last Year: Never true    Ran Out of Food in the Last Year: Never true  Transportation Needs: No Transportation Needs (03/26/2024)   PRAPARE - Administrator, Civil Service (Medical): No    Lack of Transportation (Non-Medical): No  Physical Activity: Not on file  Stress: Not on file  Social Connections: Moderately Integrated (03/26/2024)   Social Connection and Isolation Panel [NHANES]    Frequency of Communication with Friends and Family: More than three times a week    Frequency of Social Gatherings with Friends and Family: More than three times a week    Attends Religious Services: More than 4 times per year    Active Member of [employer] or Organizations: Yes    Attends Banker Meetings: Never    Marital Status: Widowed  Intimate Partner Violence: Not At Risk (03/26/2024)   Humiliation, Afraid, Rape, and Kick questionnaire    Fear of Current or Ex-Partner: No    Emotionally Abused: No    Physically Abused: No    Sexually Abused: No    Family History  Problem Relation Age of Onset   Cancer Mother    Cholelithiasis Father    Tuberculosis Father    Cancer Father        Prostate and Larynx   Cholelithiasis Sister    Breast cancer Maternal Aunt      Vitals:   03/26/24 1645 03/26/24 1700 03/26/24 1715 03/26/24 1730  BP: 116/76 122/73 102/80 (!) 142/68  Pulse:  77 69 87  Resp: 19 15 14 17   Temp:    98.1 F  (36.7 C)  TempSrc:    Oral  SpO2:  91% 90% 98%  Weight:      Height:        PHYSICAL EXAM General: well appearing elderly female, emaciated, in no acute distress. HEENT: Normocephalic and atraumatic. Neck: No JVD.   Lungs: Normal respiratory effort on room air. Clear bilaterally to auscultation. No wheezes, crackles, rhonchi.  Heart: HRRR. Normal S1 and S2,  + systolic murmur Abdomen: Non-distended appearing.  Msk: Normal strength and tone for age. Extremities: Warm and well perfused.  No clubbing, cyanosis. No edema.  Neuro: Alert and oriented X 3. Psych: Answers questions appropriately.   Labs: Basic Metabolic Panel: Recent Labs    03/25/24 2015 03/26/24 0911 03/26/24 1253  NA 145 142  --   K 4.3 4.6  --   CL 105 106  --   CO2 25 22  --   GLUCOSE 91 131*  --   BUN 44* 48*  --   CREATININE 2.58* 2.28* 2.14*  CALCIUM  9.4 8.3*  --   PHOS  --  3.6  --    Liver Function Tests: Recent Labs    03/25/24 2015 03/26/24 0911  AST 144*  --   ALT 114*  --   ALKPHOS 54  --   BILITOT 0.6  --   PROT 6.6  --   ALBUMIN 3.6 2.8*   No results for input(s): "LIPASE", "AMYLASE" in the last 72 hours. CBC: Recent Labs    03/25/24 2015 03/26/24 0910  WBC 24.7* 16.0*  NEUTROABS  --  12.9*  HGB 12.6 11.4*  HCT 40.0 35.3*  MCV 95.5 95.7  PLT 123* 93*   Cardiac Enzymes: Recent Labs    03/25/24 2015 03/26/24 0911 03/26/24 1040 03/26/24 1253  CKTOTAL 982* 750*  --  651*  TROPONINIHS  --  1,012* 748*  --    BNP: No results for input(s): "BNP" in the last 72 hours. D-Dimer: No results for input(s): "DDIMER" in the last 72 hours. Hemoglobin A1C: No results for input(s): "HGBA1C" in the last 72 hours. Fasting Lipid Panel: No results for input(s): "CHOL", "HDL", "LDLCALC", "TRIG", "CHOLHDL", "LDLDIRECT" in the last 72 hours. Thyroid Function Tests: No results for input(s): "TSH", "T4TOTAL", "T3FREE", "THYROIDAB" in the last 72 hours.  Invalid input(s): "FREET3" Anemia  Panel: No results for input(s): "VITAMINB12", "FOLATE", "FERRITIN", "TIBC", "IRON ", "RETICCTPCT" in the last 72 hours.   Radiology: [facility] Chest Port 1 View Result Date: 03/26/2024 CLINICAL DATA:  100030 Leukocytosis 100030 EXAM: PORTABLE CHEST 1 VIEW COMPARISON:  10/12/2021. FINDINGS: There are probable atelectatic changes at the lung bases. Bilateral lung fields are otherwise clear. No dense consolidation or lung collapse. Bilateral costophrenic angles are clear. Stable cardio-mediastinal silhouette. No acute osseous abnormalities. The soft tissues are within normal limits. IMPRESSION: No active disease. Electronically Signed   By: Beula Brunswick M.D.   On: 03/26/2024 08:31    ECHO ordered  TELEMETRY reviewed by me 03/26/2024: sinus rhythm, rate 80s  EKG reviewed by me: sinus rhythm, rate 89 bpm  Data reviewed by me 03/26/2024: last 24h vitals tele labs imaging I/O ED provider note, admission H&P.  Principal Problem:   Septic shock (HCC)    ASSESSMENT AND PLAN:  Lindsey Burke is a 89 y.o. female  with a past medical history of MVP, hyperlipidemia, COPD, CKD who presented to the ED on 03/25/2024 after a fall. Work up of patient revealed elevated troponins in setting of severe sepsis with shock and rhabdo. Cardiology was consulted for further evaluation.   # Severe Sepsis with shock # UTI # Rhabdomyolsyis  # Demand Ischemia # Hyperlipidemia Patient reports to ED after a fall and patient could not gte up after 11 hours due to weakness. Patient denies any cardiac sxs. CK 982 > 750 > 651. Troponin 1K > 700. EKG in ED with no acute ischemic changes. Plts this AM decreased to 93. Patient started on levo gtt due to soft BP. Patient off levo gtt as of 1700 on 05/21. BP improving -Echo ordered -  IVF for rhabdo -Defer resuming home ASA due to low platelet count.  -Consider resuming home metoprolol  tartarte 25 mg tomorrow if BP stable.  -Elevated troponins in setting of severe sepsis with shock  and rhabdo is most consistent with demand/supply mismatch and not ACS. -Severe sepsis, shock, rhabdo management per primary and critical care team.    This patient's plan of care was discussed and created with Dr. Beau Bound and he is in agreement.  Signed: Creighton Doffing, PA-C  03/26/2024, 5:44 PM [facility] Cardiology

## 2024-03-26 NOTE — ED Notes (Signed)
 ED Provider at bedside.

## 2024-03-26 NOTE — H&P (Signed)
 NAME:  Lindsey Burke, MRN:  [id number], DOB:  19-Jul-1934, LOS: 0 ADMISSION DATE:  03/25/2024, CONSULTATION DATE:  03/26/24 REFERRING MD:  Dr. Margery Sheets, CHIEF COMPLAINT:  Fall, generalized weakness   Brief Pt Description / Synopsis:  89 y.o female presenting status post fall at home, who is admitted with Severe Sepsis with Septic Shock due to UTI, along with AKI and Rhabdomyolysis.  History of Present Illness:  Lindsey Burke is a 89 year old female with a past medical history significant for COPD, gallstones, hepatitis, kidney stones, mitral valve prolapse, hyperlipidemia, ovarian cyst, back pain, and macular degeneration who presented to [facility] ED on 03/25/2024 status post fall at home.  The patient and family reports that she went to the bathroom at around 4:00 AM and had a fall and was too weak to get back up.  She remained there until approximately 5 PM later in the day when family found her.  Patient denies hitting her head, loss of consciousness, or any trauma.  She also reports progressive generalized weakness over the past few weeks.  She denies vision changes, tinnitus, difficulty speaking, facial droop, focal deficits, chest pain, shortness of breath, abdominal pain, nausea, vomiting, diarrhea, dysuria, fever, chills.  ED Course: Initial Vital Signs: Temperature 98.4 F, Pulse 93, RR 18, BP 104/52, SpO2 98% Significant Labs: BUN 44, Creatinine 2.58, AST 144, ALT 114, CK 982, WBC 24.7, Lactic acid 5.0, platelets 123 Urinalysis consistent with UTI Imaging Chest X-ray>>IMPRESSION: No active disease. Medications Administered: 2L IV fluids, IV Cefepime and Vancomycin, peripheral Levophed started  She met sepsis criteria, therefore IV fluid resuscitation, cultures, and broad-spectrum antibiotics administered.  Despite IV fluids she remained hypotensive requiring initiation of peripheral Levophed.  PCCM asked to admit for further workup and treatment.  Please see "Significant Hospital Events"  section below for full detailed hospital course.   Pertinent  Medical History   Past Medical History:  Diagnosis Date   Back pain    Cholelithiasis    COPD (chronic obstructive pulmonary disease) (HCC)    Gallstones    GERD (gastroesophageal reflux disease)    Hammertoe    Hepatitis    Hyperlipidemia    Kidney stones 1970   Macular degeneration    MVP (mitral valve prolapse)    Nephrolithiasis    Ovarian cyst 2015   Ovarian cyst     Micro Data:  5/21: Blood cultures>> no  growth to date 5/21: Urine>> 5/21: COVID PCR>>negative 5/21: MRSA PCR>> negative  Antimicrobials:   Anti-infectives (From admission, onward)    Start     Dose/Rate Route Frequency Ordered Stop   03/25/24 2330  ceFEPIme (MAXIPIME) 2 g in sodium chloride  0.9 % 100 mL IVPB        2 g 200 mL/hr over 30 Minutes Intravenous  Once 03/25/24 2318 03/26/24 0032   03/25/24 2330  vancomycin (VANCOREADY) IVPB 750 mg/150 mL        750 mg 150 mL/hr over 60 Minutes Intravenous  Once 03/25/24 2320 03/26/24 0155       Significant Hospital Events: Including procedures, antibiotic start and stop dates in addition to other pertinent events   5/20: Presented to ED following fall at home and inability to get up.  Found to have UTI, remained hypotensive despite IV fluids requiring initiation of peripheral Levophed.  PCCM asked to admit 5/21: Has been weaned off Levophed this morning, hemodynamically stable, on room air.  Obtain Renal and RUQ Abdominal Ultrasounds.  Troponin peaked at 1012, Cardiology consulted, Echo  pending.  Interim History / Subjective:  As outlined above under "Significant Hospital Events" section  Objective    Blood pressure (!) 109/54, pulse 83, temperature 97.7 F (36.5 C), temperature source Oral, resp. rate 16, height 5' (1.524 m), weight 36.3 kg, SpO2 98%.        Intake/Output Summary (Last 24 hours) at 03/26/2024 0731 Last data filed at 03/26/2024 0709 Gross per 24 hour  Intake 32.48 ml   Output --  Net 32.48 ml   Filed Weights   03/25/24 2008  Weight: 36.3 kg    Examination: General: Acutely ill appearing elderly frail female, laying in bed, in RA, in NAD HENT: Atraumatic, normocephalic, neck supple, no JVD Lungs: Clear breath sounds throughout, even, nonlabored, Cardiovascular: RRR, s1s2, no M/R/G Abdomen: Soft, nontender, nondistended, no guarding or rebound tenderness, BS +x4 Extremities: Generalized weakness, normal bulk and tone, no deformities, no edema Neuro: Awake and alert, oriented x3, moves all extremities to commands, no focal deficits, speech clear, PERRL GU: Deferred  Resolved Hospital Problem list     Assessment & Plan:   #Shock: Hypovolemic + Septic #Elevated Troponin, suspect demand ischemia PMHx: Mitral valve prolapse -Continuous cardiac monitoring -Maintain MAP >65 -IV fluids -Vasopressors as needed to maintain MAP goal -Trend lactic acid until normalized (5 ~ 3.9 ~ 2.8 ~ 2.9 ~ ) -HS Troponin peaked at 1012 -Echocardiogram pending -Cardiology consulted, appreciate input ~ holding off on Heparin gtt given thrombocytopenia and low suspicion for ACS  #Severe Sepsis due to ... #UTI -Monitor fever curve -Trend WBC's & Procalcitonin -Follow cultures as above -Continue empiric Cefepime & Vancomycin pending cultures & sensitivities  #Acute Kidney Injury #Rhabdomyolysis -Monitor I&O's / urinary output -Follow BMP -Ensure adequate renal perfusion -Avoid nephrotoxic agents as able -Replace electrolytes as indicated ~ Pharmacy following for assistance with electrolyte replacement -IV fluids -Trend CK -Obtain Renal US   #Mildly Elevated LFT's -Trend LFT's and coags -Avoid Hepatotoxic agents -Obtain RUQ US   #Thrombocytopenia, suspect due to Sepsis -Monitor for S/Sx of bleeding -Trend CBC -Heparin SQ for VTE Prophylaxis  -Transfuse for Hgb <7 -Peripheral Smear negative for schistocytes      Best Practice (right click and  "Reselect all SmartList Selections" daily)   Diet/type: Regular consistency (see orders) DVT prophylaxis: prophylactic heparin  GI prophylaxis: PPI Lines: N/A Foley:  N/A Code Status:  full code Last date of multidisciplinary goals of care discussion [5/21]  5/21: Pt and her daughter updated at bedside on plan of care.  Labs   CBC: Recent Labs  Lab 03/25/24 2015  WBC 24.7*  HGB 12.6  HCT 40.0  MCV 95.5  PLT 123*    Basic Metabolic Panel: Recent Labs  Lab 03/25/24 2015  NA 145  K 4.3  CL 105  CO2 25  GLUCOSE 91  BUN 44*  CREATININE 2.58*  CALCIUM  9.4   GFR: Estimated Creatinine Clearance: 8.8 mL/min (A) (by C-G formula based on SCr of 2.58 mg/dL (H)). Recent Labs  Lab 03/25/24 2015 03/26/24 0134 03/26/24 0416  WBC 24.7*  --   --   LATICACIDVEN  --  5.0* 3.9*    Liver Function Tests: Recent Labs  Lab 03/25/24 2015  AST 144*  ALT 114*  ALKPHOS 54  BILITOT 0.6  PROT 6.6  ALBUMIN 3.6   No results for input(s): "LIPASE", "AMYLASE" in the last 168 hours. No results for input(s): "AMMONIA" in the last 168 hours.  ABG No results found for: "PHART", "PCO2ART", "PO2ART", "HCO3", "TCO2", "ACIDBASEDEF", "O2SAT"  Coagulation Profile: No results for input(s): "INR", "PROTIME" in the last 168 hours.  Cardiac Enzymes: Recent Labs  Lab 03/25/24 2015  CKTOTAL 982*    HbA1C: Hgb A1c MFr Bld  Date/Time Value Ref Range Status  04/25/2022 08:12 AM 5.6 4.8 - 5.6 % Final    Comment:    (NOTE) Pre diabetes:          5.7%-6.4%  Diabetes:              >6.4%  Glycemic control for   <7.0% adults with diabetes     CBG: No results for input(s): "GLUCAP" in the last 168 hours.  Review of Systems:   Positives in BOLD: Gen: Denies fever, chills, weight change, generalized weakness/fatigue, night sweats HEENT: Denies blurred vision, double vision, hearing loss, tinnitus, sinus congestion, rhinorrhea, sore throat, neck stiffness, dysphagia PULM: Denies  shortness of breath, cough, sputum production, hemoptysis, wheezing CV: Denies chest pain, edema, orthopnea, paroxysmal nocturnal dyspnea, palpitations GI: Denies abdominal pain, nausea, vomiting, diarrhea, hematochezia, melena, constipation, change in bowel habits GU: Denies dysuria, hematuria, polyuria, oliguria, urethral discharge Endocrine: Denies hot or cold intolerance, polyuria, polyphagia or appetite change Derm: Denies rash, dry skin, scaling or peeling skin change Heme: Denies easy bruising, bleeding, bleeding gums Neuro: Denies headache, numbness, weakness, slurred speech, loss of memory or consciousness   Past Medical History:  She,  has a past medical history of Back pain, Cholelithiasis, COPD (chronic obstructive pulmonary disease) (HCC), Gallstones, GERD (gastroesophageal reflux disease), Hammertoe, Hepatitis, Hyperlipidemia, Kidney stones (1970), Macular degeneration, MVP (mitral valve prolapse), Nephrolithiasis, Ovarian cyst (2015), and Ovarian cyst.   Surgical History:   Past Surgical History:  Procedure Laterality Date   AUGMENTATION MAMMAPLASTY Bilateral    bilateral implants   BREAST SURGERY     implants   colonscopy  11/06/2010   Unremarkable-Dr Felicita Horns   ESOPHAGOGASTRODUODENOSCOPY (EGD) WITH PROPOFOL  N/A 06/18/2015   Procedure: ESOPHAGOGASTRODUODENOSCOPY (EGD) WITH PROPOFOL ;  Surgeon: Cassie Click, MD;  Location: [facility] ENDOSCOPY;  Service: Endoscopy;  Laterality: N/A;   ESOPHAGOGASTRODUODENOSCOPY (EGD) WITH PROPOFOL  N/A 10/16/2021   Procedure: ESOPHAGOGASTRODUODENOSCOPY (EGD) WITH PROPOFOL ;  Surgeon: Selena Daily, MD;  Location: [facility] ENDOSCOPY;  Service: Gastroenterology;  Laterality: N/A;   SAVORY DILATION N/A 06/18/2015   Procedure: SAVORY DILATION;  Surgeon: Cassie Click, MD;  Location[facility] ENDOSCOPY;  Service: Endoscopy;  Laterality: N/A;     Social History:   reports that she has never smoked. She has never used smokeless tobacco. She reports  current alcohol use of about 1.0 standard drink of alcohol per week. She reports that she does not use drugs.   Family History:  Her family history includes Breast cancer in her maternal aunt; Cancer in her father and mother; Cholelithiasis in her father and sister; Tuberculosis in her father.   Allergies Allergies  Allergen Reactions   Augmentin [Amoxicillin-Pot Clavulanate] Other (See Comments)    Elevated Liver Enzymes     Home Medications  Prior to Admission medications   Medication Sig Start Date End Date Taking? Authorizing Provider  aspirin  EC 81 MG tablet Take 81 mg by mouth daily.   Yes [provider]  cyanocobalamin (VITAMIN B12) 1000 MCG tablet Take 1,000 mcg by mouth daily.   Yes [provider]  docusate sodium  (COLACE) 100 MG capsule Take 100 mg by mouth 2 (two) times daily.   Yes [provider]  gabapentin  (NEURONTIN ) 300 MG capsule Take 1 capsule by mouth at bedtime. 02/23/15  Yes [provider]  LORazepam  (ATIVAN ) 0.5 MG tablet Take 1 tablet by mouth at bedtime. 03/23/15  Yes [provider]  metoprolol  tartrate (LOPRESSOR ) 25 MG tablet Take 25 mg by mouth 2 (two) times daily.   Yes [provider]  Multiple Vitamins-Minerals (PRESERVISION AREDS PO) Take 1 tablet by mouth daily.   Yes [provider]  Omega-3 Fatty Acids (FISH OIL) 1000 MG CAPS Take 1 capsule by mouth daily.   Yes [provider]  omeprazole  (PRILOSEC) 40 MG capsule Take 1 capsule (40 mg total) by mouth daily. 10/16/21 03/26/24 Yes Althia Atlas, MD  vitamin C (ASCORBIC ACID ) 500 MG tablet Take 500 mg by mouth daily.   Yes [provider]     Critical care time: 55 minutes     Cherylann Corpus, AGACNP-BC [facility] Prefer epic messenger for cross cover needs If after hours, please call E-link

## 2024-03-26 NOTE — Plan of Care (Signed)
  Problem: Education: Goal: Knowledge of General Education information will improve Description: Including pain rating scale, medication(s)/side effects and non-pharmacologic comfort measures Outcome: Progressing   Problem: Clinical Measurements: Goal: Ability to maintain clinical measurements within normal limits will improve Outcome: Progressing   Problem: Clinical Measurements: Goal: Respiratory complications will improve Outcome: Progressing   Problem: Clinical Measurements: Goal: Cardiovascular complication will be avoided Outcome: Progressing   Problem: Pain Managment: Goal: General experience of comfort will improve and/or be controlled Outcome: Progressing   Problem: Safety: Goal: Ability to remain free from injury will improve Outcome: Progressing   Problem: Skin Integrity: Goal: Risk for impaired skin integrity will decrease Outcome: Progressing

## 2024-03-26 NOTE — ED Notes (Signed)
 Report given at bedside to [place], [place]

## 2024-03-26 NOTE — ED Notes (Signed)
 This RN called ICU to give report and left number for nurse to call back.

## 2024-03-26 NOTE — ED Notes (Signed)
 This RN called ICU to give report. Nurse not ready to take report.

## 2024-03-26 NOTE — Progress Notes (Signed)
 Right arm PIV with LR infusion appears swollen. Infusion stopped and attempted x 2 to place PIV without success. IVT consult placed.

## 2024-03-27 DIAGNOSIS — A419 Sepsis, unspecified organism: Secondary | ICD-10-CM

## 2024-03-27 DIAGNOSIS — R6521 Severe sepsis with septic shock: Secondary | ICD-10-CM

## 2024-03-27 LAB — ECHOCARDIOGRAM COMPLETE
Area-P 1/2: 4.89 cm2
Height: 60 in
S' Lateral: 2 cm
Weight: 1386.25 [oz_av]

## 2024-03-27 LAB — GLUCOSE, CAPILLARY
Glucose-Capillary: 55 mg/dL — ABNORMAL LOW (ref 70–99)
Glucose-Capillary: 105 mg/dL — ABNORMAL HIGH (ref 70–99)
Glucose-Capillary: 46 mg/dL — ABNORMAL LOW (ref 70–99)
Glucose-Capillary: 119 mg/dL — ABNORMAL HIGH (ref 70–99)

## 2024-03-27 LAB — RENAL FUNCTION PANEL
Albumin: 2.7 g/dL — ABNORMAL LOW (ref 3.5–5.0)
Anion gap: 9 (ref 5–15)
BUN: 42 mg/dL — ABNORMAL HIGH (ref 8–23)
CO2: 23 mmol/L (ref 22–32)
Calcium: 8.5 mg/dL — ABNORMAL LOW (ref 8.9–10.3)
Chloride: 111 mmol/L (ref 98–111)
Creatinine, Ser: 1.3 mg/dL — ABNORMAL HIGH (ref 0.44–1.00)
GFR, Estimated: 40 mL/min — ABNORMAL LOW (ref >60)
Glucose, Bld: 66 mg/dL — ABNORMAL LOW (ref 70–99)
Phosphorus: 2.6 mg/dL (ref 2.5–4.6)
Potassium: 4 mmol/L (ref 3.5–5.1)
Sodium: 143 mmol/L (ref 135–145)

## 2024-03-27 LAB — CBC
HCT: 38.4 % (ref 36.0–46.0)
Hemoglobin: 12.3 g/dL (ref 12.0–15.0)
MCH: 30.7 pg (ref 26.0–34.0)
MCHC: 32 g/dL (ref 30.0–36.0)
MCV: 95.8 fL (ref 80.0–100.0)
Platelets: 89 10*3/uL — ABNORMAL LOW (ref 150–400)
RBC: 4.01 MIL/uL (ref 3.87–5.11)
RDW: 13.1 % (ref 11.5–15.5)
WBC: 12.3 10*3/uL — ABNORMAL HIGH (ref 4.0–10.5)
nRBC: 0 % (ref 0.0–0.2)

## 2024-03-27 LAB — HEPATIC FUNCTION PANEL
ALT: 79 U/L — ABNORMAL HIGH (ref 0–44)
AST: 82 U/L — ABNORMAL HIGH (ref 15–41)
Albumin: 2.7 g/dL — ABNORMAL LOW (ref 3.5–5.0)
Alkaline Phosphatase: 69 U/L (ref 38–126)
Bilirubin, Direct: 0.1 mg/dL (ref 0.0–0.2)
Indirect Bilirubin: 1.1 mg/dL — ABNORMAL HIGH (ref 0.3–0.9)
Total Bilirubin: 1.2 mg/dL (ref 0.0–1.2)
Total Protein: 5.3 g/dL — ABNORMAL LOW (ref 6.5–8.1)

## 2024-03-27 LAB — CK: Total CK: 260 U/L — ABNORMAL HIGH (ref 38–234)

## 2024-03-27 MED ORDER — SODIUM CHLORIDE 0.9 % IV SOLN
1.0000 g | Freq: Every day | INTRAVENOUS | Status: DC
  Administered 2024-03-27 – 2024-03-29 (×3): 1 g via INTRAVENOUS
  Filled 2024-03-27 (×4): qty 10

## 2024-03-27 MED ORDER — DEXTROSE 5 % IV SOLN: INTRAVENOUS | Status: AC

## 2024-03-27 MED ORDER — GLUCOSE 40 % PO GEL
2.0000 | ORAL | Status: AC
  Administered 2024-03-27: 62 g via ORAL
  Filled 2024-03-27: qty 2

## 2024-03-27 MED ORDER — ENSURE ENLIVE PO LIQD
237.0000 mL | Freq: Two times a day (BID) | ORAL | Status: DC
  Administered 2024-03-27 – 2024-03-29 (×4): 237 mL via ORAL

## 2024-03-27 NOTE — Progress Notes (Signed)
 [facility] CLINIC CARDIOLOGY PROGRESS NOTE       Patient ID: Lindsey Burke MRN: [id number] DOB/AGE: 12/20/1933 89 y.o.  Admit date: 03/25/2024 Referring Physician Dr. Jaclynn Mast Primary Physician Westley Hammers, MD Primary Cardiologist Dr. Parks Bollman Reason for Consultation elevated trops  HPI: Lindsey Burke is a 89 y.o. female  with a past medical history of MVP, hyperlipidemia, COPD, CKD who presented to the ED on 03/25/2024 after a fall. Work up of patient revealed elevated troponins in setting of severe sepsis with shock and rhabdo. Cardiology was consulted for further evaluation.   Interval History: -Patient seen and examined this AM and laying comfortably  in hospital bed. Patient states she feels good this AM and denies any cardiac sxs.  -Patients BP and HR  improved and stable. Remains off pressors.  Overnight Tele showed no  significant events.  -Patient remains on room air with stable SpO2.     Review of systems complete and found to be negative unless listed above    Past Medical History:  Diagnosis Date   Back pain    Cholelithiasis    COPD (chronic obstructive pulmonary disease) (HCC)    Gallstones    GERD (gastroesophageal reflux disease)    Hammertoe    Hepatitis    Hyperlipidemia    Kidney stones 1970   Macular degeneration    MVP (mitral valve prolapse)    Nephrolithiasis    Ovarian cyst 2015   Ovarian cyst     Past Surgical History:  Procedure Laterality Date   AUGMENTATION MAMMAPLASTY Bilateral    bilateral implants   BREAST SURGERY     implants   colonscopy  11/06/2010   Unremarkable-Dr Felicita Horns   ESOPHAGOGASTRODUODENOSCOPY (EGD) WITH PROPOFOL  N/A 06/18/2015   Procedure: ESOPHAGOGASTRODUODENOSCOPY (EGD) WITH PROPOFOL ;  Surgeon: Cassie Click, MD;  Location: [facility] ENDOSCOPY;  Service: Endoscopy;  Laterality: N/A;   ESOPHAGOGASTRODUODENOSCOPY (EGD) WITH PROPOFOL  N/A 10/16/2021   Procedure: ESOPHAGOGASTRODUODENOSCOPY (EGD) WITH PROPOFOL ;   Surgeon: Selena Daily, MD;  Location: [facility] ENDOSCOPY;  Service: Gastroenterology;  Laterality: N/A;   SAVORY DILATION N/A 06/18/2015   Procedure: SAVORY DILATION;  Surgeon: Cassie Click, MD;  Location: [facility] ENDOSCOPY;  Service: Endoscopy;  Laterality: N/A;    Medications Prior to Admission  Medication Sig Dispense Refill Last Dose/Taking   aspirin  EC 81 MG tablet Take 81 mg by mouth daily.   03/24/2024   cyanocobalamin (VITAMIN B12) 1000 MCG tablet Take 1,000 mcg by mouth daily.   03/24/2024   docusate sodium  (COLACE) 100 MG capsule Take 100 mg by mouth 2 (two) times daily.   03/24/2024   gabapentin  (NEURONTIN ) 300 MG capsule Take 1 capsule by mouth at bedtime.   03/24/2024   LORazepam  (ATIVAN ) 0.5 MG tablet Take 1 tablet by mouth at bedtime.   03/24/2024   metoprolol  tartrate (LOPRESSOR ) 25 MG tablet Take 25 mg by mouth 2 (two) times daily.   03/24/2024   Multiple Vitamins-Minerals (PRESERVISION AREDS PO) Take 1 tablet by mouth daily.   03/24/2024   Omega-3 Fatty Acids (FISH OIL) 1000 MG CAPS Take 1 capsule by mouth daily.   03/24/2024   omeprazole  (PRILOSEC) 40 MG capsule Take 1 capsule (40 mg total) by mouth daily. 30 capsule 11 03/24/2024   vitamin C (ASCORBIC ACID ) 500 MG tablet Take 500 mg by mouth daily.   03/24/2024   Social History   Socioeconomic History   Marital status: Widowed    Spouse name: Not on file   Number of  children: Not on file   Years of education: Not on file   Highest education level: Not on file  Occupational History   Not on file  Tobacco Use   Smoking status: Never   Smokeless tobacco: Never  Substance and Sexual Activity   Alcohol use: Yes    Alcohol/week: 1.0 standard drink of alcohol    Types: 1 Glasses of wine per week    Comment: occasional   Drug use: No   Sexual activity: Not on file  Other Topics Concern   Not on file  Social History Narrative   Not on file   Social Drivers of Health   Financial Resource Strain: Not on file  Food  Insecurity: No Food Insecurity (03/26/2024)   Hunger Vital Sign    Worried About Running Out of Food in the Last Year: Never true    Ran Out of Food in the Last Year: Never true  Transportation Needs: No Transportation Needs (03/26/2024)   PRAPARE - Administrator, Civil Service (Medical): No    Lack of Transportation (Non-Medical): No  Physical Activity: Not on file  Stress: Not on file  Social Connections: Moderately Integrated (03/26/2024)   Social Connection and Isolation Panel [NHANES]    Frequency of Communication with Friends and Family: More than three times a week    Frequency of Social Gatherings with Friends and Family: More than three times a week    Attends Religious Services: More than 4 times per year    Active Member of [employer] or Organizations: Yes    Attends Banker Meetings: Never    Marital Status: Widowed  Intimate Partner Violence: Not At Risk (03/26/2024)   Humiliation, Afraid, Rape, and Kick questionnaire    Fear of Current or Ex-Partner: No    Emotionally Abused: No    Physically Abused: No    Sexually Abused: No    Family History  Problem Relation Age of Onset   Cancer Mother    Cholelithiasis Father    Tuberculosis Father    Cancer Father        Prostate and Larynx   Cholelithiasis Sister    Breast cancer Maternal Aunt      Vitals:   03/26/24 2032 03/27/24 0000 03/27/24 0400 03/27/24 0800  BP:      Pulse:      Resp:      Temp: 98 F (36.7 C) 97.9 F (36.6 C) 98 F (36.7 C) 98 F (36.7 C)  TempSrc:    Oral  SpO2:      Weight:      Height:        PHYSICAL EXAM General: well appearing elderly female, emaciated, in no acute distress. HEENT: Normocephalic and atraumatic. Neck: No JVD.   Lungs: Normal respiratory effort on room air. Clear bilaterally to auscultation. No wheezes, crackles, rhonchi.  Heart: HRRR. Normal S1 and S2,  + systolic murmur Abdomen: Non-distended appearing.  Msk: Normal strength and tone for  age. Extremities: Warm and well perfused. No clubbing, cyanosis. No edema.  Neuro: Alert and oriented X 3. Psych: Answers questions appropriately.   Labs: Basic Metabolic Panel: Recent Labs    03/26/24 0911 03/26/24 1253 03/27/24 0340  NA 142  --  143  K 4.6  --  4.0  CL 106  --  111  CO2 22  --  23  GLUCOSE 131*  --  66*  BUN 48*  --  42*  CREATININE 2.28* 2.14* 1.30*  CALCIUM  8.3*  --  8.5*  PHOS 3.6  --  2.6   Liver Function Tests: Recent Labs    03/25/24 2015 03/26/24 0911 03/27/24 0340  AST 144*  --  82*  ALT 114*  --  79*  ALKPHOS 54  --  69  BILITOT 0.6  --  1.2  PROT 6.6  --  5.3*  ALBUMIN 3.6 2.8* 2.7*  2.7*   No results for input(s): "LIPASE", "AMYLASE" in the last 72 hours. CBC: Recent Labs    03/26/24 0910 03/27/24 0340  WBC 16.0* 12.3*  NEUTROABS 12.9*  --   HGB 11.4* 12.3  HCT 35.3* 38.4  MCV 95.7 95.8  PLT 93* 89*   Cardiac Enzymes: Recent Labs    03/26/24 0911 03/26/24 1040 03/26/24 1253 03/27/24 0340  CKTOTAL 750*  --  651* 260*  TROPONINIHS 1,012* 748*  --   --    BNP: No results for input(s): "BNP" in the last 72 hours. D-Dimer: No results for input(s): "DDIMER" in the last 72 hours. Hemoglobin A1C: No results for input(s): "HGBA1C" in the last 72 hours. Fasting Lipid Panel: No results for input(s): "CHOL", "HDL", "LDLCALC", "TRIG", "CHOLHDL", "LDLDIRECT" in the last 72 hours. Thyroid Function Tests: No results for input(s): "TSH", "T4TOTAL", "T3FREE", "THYROIDAB" in the last 72 hours.  Invalid input(s): "FREET3" Anemia Panel: No results for input(s): "VITAMINB12", "FOLATE", "FERRITIN", "TIBC", "IRON ", "RETICCTPCT" in the last 72 hours.   Radiology: ECHOCARDIOGRAM COMPLETE Result Date: 03/27/2024    ECHOCARDIOGRAM REPORT   Patient Name:   ANDRIANA CASA [facility] Date of Exam: 03/26/2024 Medical Rec #:  [id number]           Height:       60.0 in Accession #:    [id number]          Weight:       86.6 lb Date of Birth:  Oct 19, 1934             BSA:          1.308 m Patient Age:    89 years            BP:           134/76 mmHg Patient Gender: F                   HR:           76 bpm. Exam Location:  [facility] Procedure: 2D Echo, Cardiac Doppler and Color Doppler (Both Spectral and Color            Flow Doppler were utilized during procedure). Indications:     R57.9 Shock  History:         Patient has prior history of Echocardiogram examinations, most                  recent 10/14/2021. COPD, Mitral Valve Prolapse; Risk                  Factors:Dyslipidemia.  Sonographer:     Brigid Canada RDCS Referring Phys:  [id number] Delanna Fears Diagnosing Phys: Dwayne D Callwood MD IMPRESSIONS  1. Left ventricular ejection fraction, by estimation, is 55 to 60%. The left ventricle has normal function. The left ventricle has no regional wall motion abnormalities. Left ventricular diastolic parameters were normal.  2. Right ventricular systolic function is normal. The right ventricular size is normal.  3. The mitral valve is grossly normal. Mild mitral valve regurgitation.  4. Tricuspid valve  regurgitation is mild to moderate.  5. The aortic valve is normal in structure. Aortic valve regurgitation is not visualized. FINDINGS  Left Ventricle: Left ventricular ejection fraction, by estimation, is 55 to 60%. The left ventricle has normal function. The left ventricle has no regional wall motion abnormalities. Global longitudinal strain performed but not reported based on interpreter judgement due to suboptimal tracking. The left ventricular internal cavity size was normal in size. There is no left ventricular hypertrophy. Left ventricular diastolic parameters were normal. Right Ventricle: The right ventricular size is normal. No increase in right ventricular wall thickness. Right ventricular systolic function is normal. Left Atrium: Left atrial size was normal in size. Right Atrium: Right atrial size was normal in size. Pericardium: There is no evidence of  pericardial effusion. Mitral Valve: The mitral valve is grossly normal. Mild mitral valve regurgitation. Tricuspid Valve: The tricuspid valve is grossly normal. Tricuspid valve regurgitation is mild to moderate. Aortic Valve: The aortic valve is normal in structure. Aortic valve regurgitation is not visualized. Pulmonic Valve: The pulmonic valve was normal in structure. Pulmonic valve regurgitation is not visualized. Aorta: The ascending aorta was not well visualized. IAS/Shunts: No atrial level shunt detected by color flow Doppler. Additional Comments: 3D was performed not requiring image post processing on an independent workstation and was indeterminate.  LEFT VENTRICLE PLAX 2D LVIDd:         2.80 cm   Diastology LVIDs:         2.00 cm   LV e' medial:    8.30 cm/s LV PW:         1.00 cm   LV E/e' medial:  9.4 LV IVS:        1.10 cm   LV e' lateral:   11.00 cm/s LVOT diam:     1.90 cm   LV E/e' lateral: 7.1 LV SV:         55 LV SV Index:   42 LVOT Area:     2.84 cm  RIGHT VENTRICLE             IVC RV Basal diam:  3.80 cm     IVC diam: 0.90 cm RV S prime:     11.25 cm/s TAPSE (M-mode): 2.0 cm LEFT ATRIUM             Index        RIGHT ATRIUM           Index LA diam:        3.20 cm 2.45 cm/m   RA Area:     13.90 cm LA Vol (A2C):   29.5 ml 22.55 ml/m  RA Volume:   38.10 ml  29.13 ml/m LA Vol (A4C):   49.6 ml 37.92 ml/m LA Biplane Vol: 38.9 ml 29.74 ml/m  AORTIC VALVE LVOT Vmax:   94.40 cm/s LVOT Vmean:  63.733 cm/s LVOT VTI:    0.193 m  AORTA Ao Root diam: 2.80 cm MITRAL VALVE               TRICUSPID VALVE MV Area (PHT): 4.89 cm    TR Peak grad:   37.9 mmHg MV Decel Time: 155 msec    TR Vmax:        308.00 cm/s MV E velocity: 77.85 cm/s MV A velocity: 73.90 cm/s  SHUNTS MV E/A ratio:  1.05        Systemic VTI:  0.19 m  Systemic Diam: 1.90 cm Antonette Batters MD Electronically signed by Antonette Batters MD Signature Date/Time: 03/27/2024/8:26:52 AM    Final    US  Abdomen Limited  RUQ (LIVER/GB) Result Date: 03/26/2024 CLINICAL DATA:  221910 Elevated LFTs 221910 EXAM: ULTRASOUND ABDOMEN LIMITED RIGHT UPPER QUADRANT COMPARISON:  CT renal 04/26/2023 FINDINGS: Gallbladder: Gallstones within the gallbladder lumen. No gallbladder wall thickening or pericholecystic fluid visualized. No sonographic Murphy sign noted by sonographer. Common bile duct: Diameter: 3 mm Liver: No focal lesion identified. Within normal limits in parenchymal echogenicity. Portal vein is patent on color Doppler imaging with normal direction of blood flow towards the liver. Other right pleural effusion. IMPRESSION: 1. Cholelithiasis with no acute cholecystitis. 2. Right pleural effusion. Electronically Signed   By: Morgane  Naveau M.D.   On: 03/26/2024 20:29   US  RENAL Result Date: 03/26/2024 CLINICAL DATA:  Acute kidney injury. EXAM: RENAL / URINARY TRACT ULTRASOUND COMPLETE COMPARISON:  Renal ultrasound 07/25/2023. FINDINGS: Right Kidney: Renal measurements: 10.4 x 4.3 x 5.4 cm = volume: 126 mm. There is a cyst in the inferior pole measuring 3.6 x 2.1 x 2.6 cm. Echogenicity is increased. No mass or hydronephrosis visualized. Left Kidney: Renal measurements: 9.3 x 4.6 x 4.1 cm = volume: mL. Echogenicity is increased. There is a simple cyst in the inferior pole measuring 1.0 x 1.1 x 1.0 cm. No mass or hydronephrosis visualized. Bladder: There is small amount of debris layering in the bladder. Other: None. IMPRESSION: 1. No hydronephrosis. 2. Increased echogenicity of the kidneys bilaterally consistent with medical renal disease. 3. Small amount of debris layering in the bladder. Correlate with urinalysis. Electronically Signed   By: Tyron Gallon M.D.   On: 03/26/2024 20:08   DG Chest Port 1 View Result Date: 03/26/2024 CLINICAL DATA:  100030 Leukocytosis 100030 EXAM: PORTABLE CHEST 1 VIEW COMPARISON:  10/12/2021. FINDINGS: There are probable atelectatic changes at the lung bases. Bilateral lung fields are otherwise  clear. No dense consolidation or lung collapse. Bilateral costophrenic angles are clear. Stable cardio-mediastinal silhouette. No acute osseous abnormalities. The soft tissues are within normal limits. IMPRESSION: No active disease. Electronically Signed   By: Beula Brunswick M.D.   On: 03/26/2024 08:31    ECHO as above  TELEMETRY reviewed by me 03/27/2024: sinus rhythm, rate 80s  EKG reviewed by me: sinus rhythm, rate 89 bpm  Data reviewed by me 03/27/2024: last 24h vitals tele labs imaging I/O hospitalist note  Principal Problem:   Septic shock [facility])    ASSESSMENT AND PLAN:  Kelin Nixon is a 89 y.o. female  with a past medical history of MVP, hyperlipidemia, COPD, CKD who presented to the ED on 03/25/2024 after a fall. Work up of patient revealed elevated troponins in setting of severe sepsis with shock and rhabdo. Cardiology was consulted for further evaluation.   # Severe Sepsis with shock # UTI # Rhabdomyolsyis  # Demand Ischemia # Hyperlipidemia Patient reports to ED after a fall and patient could not gte up after 11 hours due to weakness. Patient denies any cardiac sxs. CK 982 > 750 > 651. Troponin 1K > 700. EKG in ED with no acute ischemic changes. Plts this AM decreased to 93. Patient started on levo gtt due to soft BP. Patient off levo gtt as of 1700 on 05/21. Echo this admission with pEF, no RWMA. BP and HR stable this AM -IVF for rhabdo -Defer resuming home ASA due to low platelet count.  -Recommend weaning off midodrine when able.  -  Consider resuming home metoprolol  tartarte 25 mg tomorrow if BP remains stable.  -Elevated troponins in setting of severe sepsis with shock and rhabdo is most consistent with demand/supply mismatch and not ACS. -Severe sepsis, shock, rhabdo management per primary and critical care team.    This patient's plan of care was discussed and created with Dr. Beau Bound and he is in agreement.  Signed: Creighton Doffing, PA-C  03/27/2024, 9:03  AM [facility] Cardiology

## 2024-03-27 NOTE — Evaluation (Signed)
 Physical Therapy Evaluation Patient Details Name: Lindsey Burke MRN: [id number] DOB: 07-19-1934 Today's Date: 03/27/2024  History of Present Illness  89 y.o female presenting status post fall at home, who is admitted with Severe Sepsis with Septic Shock due to UTI, along with AKI and Rhabdomyolysis.  Clinical Impression  Patient received in bed, daughter at bedside. She reports she did not fall, rather lowered herself to floor when she was unable to get back up onto her bed. Per daughter she remained on floor for 12 hours. Patient has some generalized pain and weakness. Required min A with bed mobility and transfers. Cga for ambulation with RW 120 feet. Patient will continue to benefit from skilled PT to improve strength, balance and safety with mobility.         If plan is discharge home, recommend the following: A little help with walking and/or transfers;A little help with bathing/dressing/bathroom;Assist for transportation;Help with stairs or ramp for entrance   Can travel by private vehicle    yes    Equipment Recommendations None recommended by PT  Recommendations for Other Services       Functional Status Assessment Patient has had a recent decline in their functional status and demonstrates the ability to make significant improvements in function in a reasonable and predictable amount of time.     Precautions / Restrictions Precautions Precautions: Fall Restrictions Weight Bearing Restrictions Per Provider Order: No      Mobility  Bed Mobility Overal bed mobility: Needs Assistance Bed Mobility: Supine to Sit, Sit to Supine     Supine to sit: Min assist Sit to supine: Min assist   General bed mobility comments: assist needed to scoot out to edge of bed and assist to bring legs back up onto bed.    Transfers Overall transfer level: Needs assistance Equipment used: Rolling walker (2 wheels) Transfers: Sit to/from Stand Sit to Stand: Min assist            General transfer comment: posterior bias with initial standing. Sitting back down unexpectedly.    Ambulation/Gait Ambulation/Gait assistance: Contact guard assist Gait Distance (Feet): 120 Feet Assistive device: Rolling walker (2 wheels) Gait Pattern/deviations: Step-through pattern, Decreased step length - right, Decreased step length - left, Decreased stride length, Narrow base of support Gait velocity: decr     General Gait Details: no LOB with ambulation  Stairs            Wheelchair Mobility     Tilt Bed    Modified Rankin (Stroke Patients Only)       Balance Overall balance assessment: Needs assistance Sitting-balance support: Feet supported Sitting balance-Leahy Scale: Good     Standing balance support: Bilateral upper extremity supported, During functional activity, Reliant on assistive device for balance Standing balance-Leahy Scale: Fair                               Pertinent Vitals/Pain Pain Assessment Pain Assessment: Faces Faces Pain Scale: Hurts a little bit Pain Location: generalized Pain Descriptors / Indicators: Discomfort, Grimacing Pain Intervention(s): Monitored during session, Repositioned    Home Living Family/patient expects to be discharged to:: Private residence Living Arrangements: Alone Available Help at Discharge: Family;Available 24 hours/day Type of Home: House Home Access: Stairs to enter Entrance Stairs-Rails: Right;Left Entrance Stairs-Number of Steps: 3-4 steps Alternate Level Stairs-Number of Steps: flight Home Layout: Laundry or work area in basement;Able to live on main level with bedroom/bathroom;Two level  Home Equipment: Agricultural consultant (2 wheels);Cane - single point Additional Comments: patient is planning to go to daughter's home at discharge    Prior Function Prior Level of Function : Independent/Modified Independent             Mobility Comments: Uses SPC or walker as needed, does not  drive ADLs Comments: Family assists with ADLs, appointments and grocery shopping     Extremity/Trunk Assessment   Upper Extremity Assessment Upper Extremity Assessment: Overall WFL for tasks assessed    Lower Extremity Assessment Lower Extremity Assessment: Generalized weakness    Cervical / Trunk Assessment Cervical / Trunk Assessment: Normal  Communication   Communication Communication: Impaired Factors Affecting Communication: Hearing impaired    Cognition Arousal: Alert Behavior During Therapy: WFL for tasks assessed/performed   PT - Cognitive impairments: No apparent impairments                         Following commands: Intact       Cueing Cueing Techniques: Verbal cues     General Comments      Exercises     Assessment/Plan    PT Assessment Patient needs continued PT services  PT Problem List Decreased strength;Decreased activity tolerance;Decreased balance;Decreased mobility       PT Treatment Interventions DME instruction;Gait training;Stair training;Functional mobility training;Therapeutic activities;Therapeutic exercise;Balance training;Patient/family education    PT Goals (Current goals can be found in the Care Plan section)  Acute Rehab PT Goals Patient Stated Goal: go home with daughter PT Goal Formulation: With patient/family Time For Goal Achievement: 04/05/24 Potential to Achieve Goals: Good    Frequency Min 2X/week     Co-evaluation               AM-PAC PT "6 Clicks" Mobility  Outcome Measure Help needed turning from your back to your side while in a flat bed without using bedrails?: A Little Help needed moving from lying on your back to sitting on the side of a flat bed without using bedrails?: A Little Help needed moving to and from a bed to a chair (including a wheelchair)?: A Little Help needed standing up from a chair using your arms (e.g., wheelchair or bedside chair)?: A Little Help needed to walk in  hospital room?: A Little Help needed climbing 3-5 steps with a railing? : A Little 6 Click Score: 18    End of Session Equipment Utilized During Treatment: Gait belt Activity Tolerance: Patient tolerated treatment well Patient left: in bed;with call bell/phone within reach;with bed alarm set;with family/visitor present Nurse Communication: Mobility status PT Visit Diagnosis: Unsteadiness on feet (R26.81);Other abnormalities of gait and mobility (R26.89);Muscle weakness (generalized) (M62.81);Difficulty in walking, not elsewhere classified (R26.2)    Time: 4696-2952 PT Time Calculation (min) (ACUTE ONLY): 18 min   Charges:   PT Evaluation $PT Eval Low Complexity: 1 Low PT Treatments $Gait Training: 8-22 mins PT General Charges $$ ACUTE PT VISIT: 1 Visit         Marquite Attwood, PT, GCS 03/27/24,2:05 PM

## 2024-03-27 NOTE — Plan of Care (Signed)

## 2024-03-27 NOTE — Progress Notes (Signed)
  PROGRESS NOTE    [name]  ZOX:[id number] DOB: 04-Dec-1933 DOA: 03/25/2024 PCP: Westley Hammers, MD  [id number]  LOS: 1 day   Brief hospital course:   Assessment & Plan: Lindsey Burke is a 89 year old female with a past medical history significant for COPD, gallstones, hepatitis, kidney stones, mitral valve prolapse, hyperlipidemia, ovarian cyst, back pain, and macular degeneration who presented to [facility] ED on 03/25/2024 status post fall at home. The patient and family reports that she went to the bathroom at around 4:00 AM and had a fall and was too weak to get back up. She remained there until approximately 5 PM later in the day when family found her.   She met sepsis criteria, therefore IV fluid resuscitation, cultures, and broad-spectrum antibiotics administered.  Despite IV fluids she remained hypotensive requiring initiation of peripheral Levophed.  PCCM asked to admit.  Pt was weaned off pressor on 5/21 and transferred to TRH service on 5/22.  #Shock: Hypovolemic + Septic --off pressor --d/c midodrine today  #Elevated Troponin, suspect demand ischemia -HS Troponin peaked at 1012 -Echocardiogram pending -Cardiology consulted, appreciate input ~ holding off on Heparin gtt given thrombocytopenia and low suspicion for ACS   #Severe Sepsis due to #UTI --started on Cefepime & Vancomycin --switch to ceftriaxone  pending urine cx   #Acute Kidney Injury #Rhabdomyolysis --Cr improved with IVF --cont MIVF   #Mildly Elevated LFT's -Trend LFT's and coags -Avoid Hepatotoxic agents -RUQ US  showed Cholelithiasis with no acute cholecystitis.    #Thrombocytopenia, suspect due to Sepsis -Peripheral Smear negative for schistocytes  --monitor  Hypoglycemia --2/2 poor oral intake and poor reserve --start D5@50  --start Ensure   DVT prophylaxis: SCD/Compression stockings Code Status: Full code  Family Communication: daughter updated at bedside today Level of care:  Med-Surg Dispo:   The patient is from: home Anticipated d/c is to: home Anticipated d/c date is: 1-2 days   Subjective and Interval History:  No urinary symptoms.   Objective: Vitals:   03/27/24 1200 03/27/24 1233 03/27/24 1533 03/27/24 1955  BP:  130/66 132/61 (!) 126/57  Pulse:  72 69 78  Resp: (!) 25 18 18 20   Temp:   98.3 F (36.8 C) 97.8 F (36.6 C)  TempSrc:   Oral Oral  SpO2:  93% 99% 96%  Weight:      Height:        Intake/Output Summary (Last 24 hours) at 03/27/2024 2053 Last data filed at 03/27/2024 0901 Gross per 24 hour  Intake 1036.85 ml  Output --  Net 1036.85 ml   Filed Weights   03/25/24 2008 03/26/24 0844  Weight: 36.3 kg 39.3 kg    Examination:   Constitutional: NAD, AAOx3 HEENT: conjunctivae and lids normal, EOMI CV: No cyanosis.   RESP: normal respiratory effort, on RA Neuro: II - XII grossly intact.   Psych: Normal mood and affect.     Data Reviewed: I have personally reviewed labs and imaging studies  Time spent: 50 minutes  Garrison Kanner, MD [facility] If 7PM-7AM, please contact night-coverage 03/27/2024, 8:53 PM

## 2024-03-27 NOTE — Progress Notes (Addendum)
 The patient has been transferred to med surg room 221B. Report given to Spcencer G, RN.

## 2024-03-27 NOTE — Progress Notes (Addendum)
 Hypoglycemic Event  CBG: 55  Treatment: 8 oz juice/soda  Symptoms: None  Follow-up CBG: Time:0830 CBG Result:46  Possible Reasons for Event: Unknown  Comments/MD notified:yes    Lindsey Burke [facility]

## 2024-03-27 NOTE — Progress Notes (Addendum)
 Hypoglycemic Event  CBG: 46  Treatment: 2 tubes glucose gel  Symptoms: None  Follow-up CBG: Time:0906   CBG Result:105  Possible Reasons for Event: Unknown  Comments/MD notified:yes    Broadus Canes [facility]

## 2024-03-28 DIAGNOSIS — R6521 Severe sepsis with septic shock: Secondary | ICD-10-CM | POA: Diagnosis not present

## 2024-03-28 DIAGNOSIS — A419 Sepsis, unspecified organism: Secondary | ICD-10-CM | POA: Diagnosis not present

## 2024-03-28 LAB — BASIC METABOLIC PANEL WITH GFR
Anion gap: 8 (ref 5–15)
BUN: 23 mg/dL (ref 8–23)
CO2: 23 mmol/L (ref 22–32)
Calcium: 8.1 mg/dL — ABNORMAL LOW (ref 8.9–10.3)
Chloride: 110 mmol/L (ref 98–111)
Creatinine, Ser: 0.74 mg/dL (ref 0.44–1.00)
GFR, Estimated: >60 mL/min (ref >60)
Glucose, Bld: 95 mg/dL (ref 70–99)
Potassium: 3.6 mmol/L (ref 3.5–5.1)
Sodium: 141 mmol/L (ref 135–145)

## 2024-03-28 LAB — MAGNESIUM: Magnesium: 1.8 mg/dL (ref 1.7–2.4)

## 2024-03-28 MED ORDER — METOPROLOL TARTRATE 25 MG PO TABS
12.5000 mg | ORAL_TABLET | Freq: Two times a day (BID) | ORAL | Status: DC
  Administered 2024-03-28 – 2024-03-29 (×3): 12.5 mg via ORAL
  Filled 2024-03-28 (×3): qty 1

## 2024-03-28 NOTE — Care Management Important Message (Signed)
 Important Message  Patient Details  Name: Lindsey Burke MRN: [id number] Date of Birth: [birth year]   Important Message Given:  Yes - Medicare IM     Anise Kerns 03/28/2024, 2:37 PM

## 2024-03-28 NOTE — Progress Notes (Signed)
 [facility] CLINIC CARDIOLOGY PROGRESS NOTE       Patient ID: Lindsey Burke MRN: [id number] DOB/AGE: June 09, 1934 89 y.o.  Admit date: 03/25/2024 Referring Physician Dr. Jaclynn Mast Primary Physician Westley Hammers, MD Primary Cardiologist Dr. Parks Bollman Reason for Consultation elevated trops  HPI: Lindsey Burke is a 89 y.o. female  with a past medical history of MVP, hyperlipidemia, COPD, CKD who presented to the ED on 03/25/2024 after a fall. Work up of patient revealed elevated troponins in setting of severe sepsis with shock and rhabdo. Cardiology was consulted for further evaluation.   Interval History: -Patient seen and examined this AM and laying comfortably in hospital bed. Patient states she feeling tired this morning. Patient's daughter reports poor intake and pt states she's not in the mood to eat. Patient continues to report no cardiac sxs. -Patients BP and HR stable.  -Patient remains on room air with stable SpO2.     Review of systems complete and found to be negative unless listed above    Past Medical History:  Diagnosis Date   Back pain    Cholelithiasis    COPD (chronic obstructive pulmonary disease) (HCC)    Gallstones    GERD (gastroesophageal reflux disease)    Hammertoe    Hepatitis    Hyperlipidemia    Kidney stones 1970   Macular degeneration    MVP (mitral valve prolapse)    Nephrolithiasis    Ovarian cyst 2015   Ovarian cyst     Past Surgical History:  Procedure Laterality Date   AUGMENTATION MAMMAPLASTY Bilateral    bilateral implants   BREAST SURGERY     implants   colonscopy  11/06/2010   Unremarkable-Dr Felicita Horns   ESOPHAGOGASTRODUODENOSCOPY (EGD) WITH PROPOFOL  N/A 06/18/2015   Procedure: ESOPHAGOGASTRODUODENOSCOPY (EGD) WITH PROPOFOL ;  Surgeon: Cassie Click, MD;  Location: [facility] ENDOSCOPY;  Service: Endoscopy;  Laterality: N/A;   ESOPHAGOGASTRODUODENOSCOPY (EGD) WITH PROPOFOL  N/A 10/16/2021   Procedure: ESOPHAGOGASTRODUODENOSCOPY  (EGD) WITH PROPOFOL ;  Surgeon: Selena Daily, MD;  Location: [facility] ENDOSCOPY;  Service: Gastroenterology;  Laterality: N/A;   SAVORY DILATION N/A 06/18/2015   Procedure: SAVORY DILATION;  Surgeon: Cassie Click, MD;  Location: [facility] ENDOSCOPY;  Service: Endoscopy;  Laterality: N/A;    Medications Prior to Admission  Medication Sig Dispense Refill Last Dose/Taking   aspirin  EC 81 MG tablet Take 81 mg by mouth daily.   03/24/2024   cyanocobalamin (VITAMIN B12) 1000 MCG tablet Take 1,000 mcg by mouth daily.   03/24/2024   docusate sodium  (COLACE) 100 MG capsule Take 100 mg by mouth 2 (two) times daily.   03/24/2024   gabapentin  (NEURONTIN ) 300 MG capsule Take 1 capsule by mouth at bedtime.   03/24/2024   LORazepam  (ATIVAN ) 0.5 MG tablet Take 1 tablet by mouth at bedtime.   03/24/2024   metoprolol  tartrate (LOPRESSOR ) 25 MG tablet Take 25 mg by mouth 2 (two) times daily.   03/24/2024   Multiple Vitamins-Minerals (PRESERVISION AREDS PO) Take 1 tablet by mouth daily.   03/24/2024   Omega-3 Fatty Acids (FISH OIL) 1000 MG CAPS Take 1 capsule by mouth daily.   03/24/2024   omeprazole  (PRILOSEC) 40 MG capsule Take 1 capsule (40 mg total) by mouth daily. 30 capsule 11 03/24/2024   vitamin C (ASCORBIC ACID ) 500 MG tablet Take 500 mg by mouth daily.   03/24/2024   Social History   Socioeconomic History   Marital status: Widowed    Spouse name: Not on file   Number  of children: Not on file   Years of education: Not on file   Highest education level: Not on file  Occupational History   Not on file  Tobacco Use   Smoking status: Never   Smokeless tobacco: Never  Substance and Sexual Activity   Alcohol use: Yes    Alcohol/week: 1.0 standard drink of alcohol    Types: 1 Glasses of wine per week    Comment: occasional   Drug use: No   Sexual activity: Not on file  Other Topics Concern   Not on file  Social History Narrative   Not on file   Social Drivers of Health   Financial Resource Strain:  Not on file  Food Insecurity: No Food Insecurity (03/26/2024)   Hunger Vital Sign    Worried About Running Out of Food in the Last Year: Never true    Ran Out of Food in the Last Year: Never true  Transportation Needs: No Transportation Needs (03/26/2024)   PRAPARE - Administrator, Civil Service (Medical): No    Lack of Transportation (Non-Medical): No  Physical Activity: Not on file  Stress: Not on file  Social Connections: Moderately Integrated (03/26/2024)   Social Connection and Isolation Panel [NHANES]    Frequency of Communication with Friends and Family: More than three times a week    Frequency of Social Gatherings with Friends and Family: More than three times a week    Attends Religious Services: More than 4 times per year    Active Member of [employer] or Organizations: Yes    Attends Banker Meetings: Never    Marital Status: Widowed  Intimate Partner Violence: Not At Risk (03/26/2024)   Humiliation, Afraid, Rape, and Kick questionnaire    Fear of Current or Ex-Partner: No    Emotionally Abused: No    Physically Abused: No    Sexually Abused: No    Family History  Problem Relation Age of Onset   Cancer Mother    Cholelithiasis Father    Tuberculosis Father    Cancer Father        Prostate and Larynx   Cholelithiasis Sister    Breast cancer Maternal Aunt      Vitals:   03/27/24 1233 03/27/24 1533 03/27/24 1955 03/28/24 0406  BP: 130/66 132/61 (!) 126/57 (!) 103/56  Pulse: 72 69 78 66  Resp: 18 18 20 20   Temp:  98.3 F (36.8 C) 97.8 F (36.6 C) 98.6 F (37 C)  TempSrc:  Oral Oral Oral  SpO2: 93% 99% 96% 96%  Weight:      Height:        PHYSICAL EXAM General: well appearing elderly female, emaciated, in no acute distress. HEENT: Normocephalic and atraumatic. Neck: No JVD.   Lungs: Normal respiratory effort on room air. Clear bilaterally to auscultation. No wheezes, crackles, rhonchi.  Heart: HRRR. Normal S1 and S2,  + systolic  murmur Abdomen: Non-distended appearing.  Msk: Normal strength and tone for age. Extremities: Warm and well perfused. No clubbing, cyanosis. No edema.  Neuro: Alert and oriented X 3. Psych: Answers questions appropriately.   Labs: Basic Metabolic Panel: Recent Labs    03/26/24 0911 03/26/24 1253 03/27/24 0340  NA 142  --  143  K 4.6  --  4.0  CL 106  --  111  CO2 22  --  23  GLUCOSE 131*  --  66*  BUN 48*  --  42*  CREATININE 2.28* 2.14* 1.30*  CALCIUM  8.3*  --  8.5*  PHOS 3.6  --  2.6   Liver Function Tests: Recent Labs    03/25/24 2015 03/26/24 0911 03/27/24 0340  AST 144*  --  82*  ALT 114*  --  79*  ALKPHOS 54  --  69  BILITOT 0.6  --  1.2  PROT 6.6  --  5.3*  ALBUMIN 3.6 2.8* 2.7*  2.7*   No results for input(s): "LIPASE", "AMYLASE" in the last 72 hours. CBC: Recent Labs    03/26/24 0910 03/27/24 0340  WBC 16.0* 12.3*  NEUTROABS 12.9*  --   HGB 11.4* 12.3  HCT 35.3* 38.4  MCV 95.7 95.8  PLT 93* 89*   Cardiac Enzymes: Recent Labs    03/26/24 0911 03/26/24 1040 03/26/24 1253 03/27/24 0340  CKTOTAL 750*  --  651* 260*  TROPONINIHS 1,012* 748*  --   --    BNP: No results for input(s): "BNP" in the last 72 hours. D-Dimer: No results for input(s): "DDIMER" in the last 72 hours. Hemoglobin A1C: No results for input(s): "HGBA1C" in the last 72 hours. Fasting Lipid Panel: No results for input(s): "CHOL", "HDL", "LDLCALC", "TRIG", "CHOLHDL", "LDLDIRECT" in the last 72 hours. Thyroid Function Tests: No results for input(s): "TSH", "T4TOTAL", "T3FREE", "THYROIDAB" in the last 72 hours.  Invalid input(s): "FREET3" Anemia Panel: No results for input(s): "VITAMINB12", "FOLATE", "FERRITIN", "TIBC", "IRON ", "RETICCTPCT" in the last 72 hours.   Radiology: ECHOCARDIOGRAM COMPLETE Result Date: 03/27/2024    ECHOCARDIOGRAM REPORT   Patient Name:   Lindsey Burke [facility] Date of Exam: 03/26/2024 Medical Rec #:  [id number]           Height:       60.0 in  Accession #:    [id number]          Weight:       86.6 lb Date of Birth:  1934-09-15            BSA:          1.308 m Patient Age:    89 years            BP:           134/76 mmHg Patient Gender: F                   HR:           76 bpm. Exam Location:  [facility] Procedure: 2D Echo, Cardiac Doppler and Color Doppler (Both Spectral and Color            Flow Doppler were utilized during procedure). Indications:     R57.9 Shock  History:         Patient has prior history of Echocardiogram examinations, most                  recent 10/14/2021. COPD, Mitral Valve Prolapse; Risk                  Factors:Dyslipidemia.  Sonographer:     Brigid Canada RDCS Referring Phys:  [id number] Delanna Fears Diagnosing Phys: Dwayne D Callwood MD IMPRESSIONS  1. Left ventricular ejection fraction, by estimation, is 55 to 60%. The left ventricle has normal function. The left ventricle has no regional wall motion abnormalities. Left ventricular diastolic parameters were normal.  2. Right ventricular systolic function is normal. The right ventricular size is normal.  3. The mitral valve is grossly normal. Mild mitral valve regurgitation.  4. Tricuspid valve  regurgitation is mild to moderate.  5. The aortic valve is normal in structure. Aortic valve regurgitation is not visualized. FINDINGS  Left Ventricle: Left ventricular ejection fraction, by estimation, is 55 to 60%. The left ventricle has normal function. The left ventricle has no regional wall motion abnormalities. Global longitudinal strain performed but not reported based on interpreter judgement due to suboptimal tracking. The left ventricular internal cavity size was normal in size. There is no left ventricular hypertrophy. Left ventricular diastolic parameters were normal. Right Ventricle: The right ventricular size is normal. No increase in right ventricular wall thickness. Right ventricular systolic function is normal. Left Atrium: Left atrial size was normal in size. Right  Atrium: Right atrial size was normal in size. Pericardium: There is no evidence of pericardial effusion. Mitral Valve: The mitral valve is grossly normal. Mild mitral valve regurgitation. Tricuspid Valve: The tricuspid valve is grossly normal. Tricuspid valve regurgitation is mild to moderate. Aortic Valve: The aortic valve is normal in structure. Aortic valve regurgitation is not visualized. Pulmonic Valve: The pulmonic valve was normal in structure. Pulmonic valve regurgitation is not visualized. Aorta: The ascending aorta was not well visualized. IAS/Shunts: No atrial level shunt detected by color flow Doppler. Additional Comments: 3D was performed not requiring image post processing on an independent workstation and was indeterminate.  LEFT VENTRICLE PLAX 2D LVIDd:         2.80 cm   Diastology LVIDs:         2.00 cm   LV e' medial:    8.30 cm/s LV PW:         1.00 cm   LV E/e' medial:  9.4 LV IVS:        1.10 cm   LV e' lateral:   11.00 cm/s LVOT diam:     1.90 cm   LV E/e' lateral: 7.1 LV SV:         55 LV SV Index:   42 LVOT Area:     2.84 cm  RIGHT VENTRICLE             IVC RV Basal diam:  3.80 cm     IVC diam: 0.90 cm RV S prime:     11.25 cm/s TAPSE (M-mode): 2.0 cm LEFT ATRIUM             Index        RIGHT ATRIUM           Index LA diam:        3.20 cm 2.45 cm/m   RA Area:     13.90 cm LA Vol (A2C):   29.5 ml 22.55 ml/m  RA Volume:   38.10 ml  29.13 ml/m LA Vol (A4C):   49.6 ml 37.92 ml/m LA Biplane Vol: 38.9 ml 29.74 ml/m  AORTIC VALVE LVOT Vmax:   94.40 cm/s LVOT Vmean:  63.733 cm/s LVOT VTI:    0.193 m  AORTA Ao Root diam: 2.80 cm MITRAL VALVE               TRICUSPID VALVE MV Area (PHT): 4.89 cm    TR Peak grad:   37.9 mmHg MV Decel Time: 155 msec    TR Vmax:        308.00 cm/s MV E velocity: 77.85 cm/s MV A velocity: 73.90 cm/s  SHUNTS MV E/A ratio:  1.05        Systemic VTI:  0.19 m  Systemic Diam: 1.90 cm Antonette Batters MD Electronically signed by Antonette Batters MD Signature Date/Time: 03/27/2024/8:26:52 AM    Final    US  Abdomen Limited RUQ (LIVER/GB) Result Date: 03/26/2024 CLINICAL DATA:  221910 Elevated LFTs 221910 EXAM: ULTRASOUND ABDOMEN LIMITED RIGHT UPPER QUADRANT COMPARISON:  CT renal 04/26/2023 FINDINGS: Gallbladder: Gallstones within the gallbladder lumen. No gallbladder wall thickening or pericholecystic fluid visualized. No sonographic Murphy sign noted by sonographer. Common bile duct: Diameter: 3 mm Liver: No focal lesion identified. Within normal limits in parenchymal echogenicity. Portal vein is patent on color Doppler imaging with normal direction of blood flow towards the liver. Other right pleural effusion. IMPRESSION: 1. Cholelithiasis with no acute cholecystitis. 2. Right pleural effusion. Electronically Signed   By: Morgane  Naveau M.D.   On: 03/26/2024 20:29   US  RENAL Result Date: 03/26/2024 CLINICAL DATA:  Acute kidney injury. EXAM: RENAL / URINARY TRACT ULTRASOUND COMPLETE COMPARISON:  Renal ultrasound 07/25/2023. FINDINGS: Right Kidney: Renal measurements: 10.4 x 4.3 x 5.4 cm = volume: 126 mm. There is a cyst in the inferior pole measuring 3.6 x 2.1 x 2.6 cm. Echogenicity is increased. No mass or hydronephrosis visualized. Left Kidney: Renal measurements: 9.3 x 4.6 x 4.1 cm = volume: mL. Echogenicity is increased. There is a simple cyst in the inferior pole measuring 1.0 x 1.1 x 1.0 cm. No mass or hydronephrosis visualized. Bladder: There is small amount of debris layering in the bladder. Other: None. IMPRESSION: 1. No hydronephrosis. 2. Increased echogenicity of the kidneys bilaterally consistent with medical renal disease. 3. Small amount of debris layering in the bladder. Correlate with urinalysis. Electronically Signed   By: Tyron Gallon M.D.   On: 03/26/2024 20:08   DG Chest Port 1 View Result Date: 03/26/2024 CLINICAL DATA:  100030 Leukocytosis 100030 EXAM: PORTABLE CHEST 1 VIEW COMPARISON:  10/12/2021. FINDINGS: There are  probable atelectatic changes at the lung bases. Bilateral lung fields are otherwise clear. No dense consolidation or lung collapse. Bilateral costophrenic angles are clear. Stable cardio-mediastinal silhouette. No acute osseous abnormalities. The soft tissues are within normal limits. IMPRESSION: No active disease. Electronically Signed   By: Beula Brunswick M.D.   On: 03/26/2024 08:31    ECHO as above  TELEMETRY reviewed by me 03/28/2024: off tele  EKG reviewed by me: sinus rhythm, rate 89 bpm  Data reviewed by me 03/28/2024: last 24h vitals tele labs imaging I/O hospitalist note  Principal Problem:   Septic shock [facility])    ASSESSMENT AND PLAN:  Lindsey Burke is a 89 y.o. female  with a past medical history of MVP, hyperlipidemia, COPD, CKD who presented to the ED on 03/25/2024 after a fall. Work up of patient revealed elevated troponins in setting of severe sepsis with shock and rhabdo. Cardiology was consulted for further evaluation.   # Severe Sepsis with shock # UTI # Rhabdomyolsyis  # Demand Ischemia # Hyperlipidemia Patient reports to ED after a fall and patient could not gte up after 11 hours due to weakness. Patient denies any cardiac sxs. CK 982 > 750 > 651. Troponin 1K > 700. EKG in ED with no acute ischemic changes. Plts this AM decreased to 93. Patient started on levo gtt due to soft BP. Patient off levo gtt as of 1700 on 05/21. Echo this admission with pEF, no RWMA. Patient weaned off midodrine. BP and HR stable this AM -Defer resuming home ASA due to low platelet count.  -Continue home metoprolol  tartarte 12.5 mg daily. -  Elevated troponins in setting of severe sepsis with shock and rhabdo is most consistent with demand/supply mismatch and not ACS. -Severe sepsis, shock, rhabdo management per primary and critical care team.   Cardiology will sign off. Please haiku with questions or re-engage if needed.   This patient's plan of care was discussed and created with Dr.  Beau Bound and he is in agreement.  Signed: Creighton Doffing, PA-C  03/28/2024, 6:39 AM [facility] Cardiology

## 2024-03-28 NOTE — Care Management Obs Status (Signed)
 MEDICARE OBSERVATION STATUS NOTIFICATION   Patient Details  Name: Lindsey Burke MRN: [id number] Date of Birth: 21-Jan-1934   Medicare Observation Status Notification Given:       Anise Kerns 03/28/2024, 2:38 PM

## 2024-03-28 NOTE — TOC Initial Note (Signed)
 Transition of Care [facility]) - Initial/Assessment Note    Patient Details  Name: Lindsey Burke MRN: [id number] Date of Birth: [birth year]  Transition of Care [facility]) CM/SW Contact:    Loman Risk, RN Phone Number: 03/28/2024, 3:40 PM  Clinical Narrative:                  Met with patient daughter Hope at discharge  Admitted for: Sepsis.  Admitted from: Lives at home alone.  Will be discharging to daughter's home [facility]) [address], [place])  PCP: Arabella Knife Current home health/prior home health/DME: RW and cane  Therapy recommending HH.  Patient in agreement.  Patient and daughter state they do not have a preference of agency.  Referral to [facility] with [place].  Randel Buss is aware that patient will be discharging to the daughters home      Patient Goals and CMS Choice            Expected Discharge Plan and Services                                              Prior Living Arrangements/Services                       Activities of Daily Living   ADL Screening (condition at time of admission) Independently performs ADLs?: Yes (appropriate for developmental age) Is the patient deaf or have difficulty hearing?: Yes Does the patient have difficulty seeing, even when wearing glasses/contacts?: No Does the patient have difficulty concentrating, remembering, or making decisions?: No  Permission Sought/Granted                  Emotional Assessment              Admission diagnosis:  Dehydration [E86.0] Weakness [R53.1] AKI (acute kidney injury) (HCC) [N17.9] Septic shock (HCC) [A41.9, R65.21] Non-traumatic rhabdomyolysis [M62.82] Sepsis, due to unspecified organism, unspecified whether acute organ dysfunction present [facility]) [A41.9] Patient Active Problem List   Diagnosis Date Noted   Septic shock (HCC) 03/26/2024   Abnormal LFTs 04/25/2022   Fall at home, initial encounter 04/25/2022   Acute metabolic encephalopathy 04/25/2022   Iron   deficiency anemia 04/25/2022   Dysphagia 10/14/2021   COVID-19 virus infection 10/13/2021   Rhabdomyolysis 10/13/2021   UTI (urinary tract infection) 10/13/2021   COPD (chronic obstructive pulmonary disease) (HCC)    Generalized weakness    Elevated troponin    Underweight    PCP:  Westley Hammers, MD Pharmacy:   [facility] [phone] [place] Gallop, [place] - [address] AT [facility] OF [address] [place] [address] [place] [address] Phone: [phone] Fax: [phone]     Social Drivers of Health (SDOH) Social History: SDOH Screenings   Food Insecurity: No Food Insecurity (03/26/2024)  Housing: Low Risk  (03/26/2024)  Transportation Needs: No Transportation Needs (03/26/2024)  Utilities: Not At Risk (03/26/2024)  Social Connections: Moderately Integrated (03/26/2024)  Tobacco Use: Low Risk  (03/25/2024)  Recent Concern: Tobacco Use - Medium Risk (03/03/2024)   Received from [facility]   SDOH Interventions:     Readmission Risk Interventions     No data to display

## 2024-03-28 NOTE — Progress Notes (Signed)
 Physical Therapy Treatment Patient Details Name: Lindsey Burke MRN: [id number] DOB: 1934/06/20 Today's Date: 03/28/2024   History of Present Illness 89 y.o female presenting status post fall at home, who is admitted with Severe Sepsis with Septic Shock due to UTI, along with AKI and Rhabdomyolysis.    PT Comments  Patient received in bed, states she is wet. Has already called nurse. Family present at bedside. Patient is agreeable to getting up. Assisted her to bathroom , min A with self care and gown change. Patient then ambulated out into hallway ~ 120 feet with RW and cga. She has STM deficits and requires min A for mobility and ADLs at this time. She will continue to benefit from skilled PT to improve strength, balance and independence with mobility.       If plan is discharge home, recommend the following: A little help with walking and/or transfers;A little help with bathing/dressing/bathroom;Assist for transportation;Help with stairs or ramp for entrance;Assistance with cooking/housework;Direct supervision/assist for medications management   Can travel by private vehicle     yes   Equipment Recommendations  None recommended by PT    Recommendations for Other Services       Precautions / Restrictions Precautions Precautions: Fall Recall of Precautions/Restrictions: Impaired Restrictions Weight Bearing Restrictions Per Provider Order: No     Mobility  Bed Mobility Overal bed mobility: Needs Assistance Bed Mobility: Supine to Sit, Sit to Supine     Supine to sit: Min assist Sit to supine: Supervision   General bed mobility comments: assist needed to scoot out to edge of bed and assist to bring legs out bed. Increased time needed    Transfers Overall transfer level: Needs assistance Equipment used: Rolling walker (2 wheels) Transfers: Sit to/from Stand Sit to Stand: Contact guard assist                Ambulation/Gait Ambulation/Gait assistance: Contact  guard assist Gait Distance (Feet): 120 Feet Assistive device: Rolling walker (2 wheels) Gait Pattern/deviations: Step-through pattern, Decreased step length - right, Decreased step length - left, Decreased stride length Gait velocity: decr     General Gait Details: no LOB with ambulation   Stairs             Wheelchair Mobility     Tilt Bed    Modified Rankin (Stroke Patients Only)       Balance Overall balance assessment: Modified Independent Sitting-balance support: Feet supported Sitting balance-Leahy Scale: Good     Standing balance support: Bilateral upper extremity supported, During functional activity, Reliant on assistive device for balance Standing balance-Leahy Scale: Fair                              Hotel manager: Impaired Factors Affecting Communication: Hearing impaired  Cognition Arousal: Alert Behavior During Therapy: WFL for tasks assessed/performed   PT - Cognitive impairments: Memory                         Following commands: Intact      Cueing Cueing Techniques: Verbal cues  Exercises      General Comments        Pertinent Vitals/Pain Pain Assessment Pain Assessment: Faces Faces Pain Scale: Hurts a little bit Pain Location: generalized Pain Descriptors / Indicators: Discomfort, Grimacing Pain Intervention(s): Monitored during session    Home Living  Prior Function            PT Goals (current goals can now be found in the care plan section) Acute Rehab PT Goals Patient Stated Goal: go home with daughter PT Goal Formulation: With patient/family Time For Goal Achievement: 04/05/24 Potential to Achieve Goals: Good Progress towards PT goals: Progressing toward goals    Frequency    Min 2X/week      PT Plan      Co-evaluation              AM-PAC PT "6 Clicks" Mobility   Outcome Measure  Help needed turning from your  back to your side while in a flat bed without using bedrails?: A Little Help needed moving from lying on your back to sitting on the side of a flat bed without using bedrails?: A Little Help needed moving to and from a bed to a chair (including a wheelchair)?: A Little Help needed standing up from a chair using your arms (e.g., wheelchair or bedside chair)?: A Little Help needed to walk in hospital room?: A Little Help needed climbing 3-5 steps with a railing? : A Little 6 Click Score: 18    End of Session   Activity Tolerance: Patient tolerated treatment well Patient left: in bed;with call bell/phone within reach;with bed alarm set;with family/visitor present Nurse Communication: Mobility status PT Visit Diagnosis: Unsteadiness on feet (R26.81);Other abnormalities of gait and mobility (R26.89);Muscle weakness (generalized) (M62.81);Difficulty in walking, not elsewhere classified (R26.2)     Time: 1610-9604 PT Time Calculation (min) (ACUTE ONLY): 22 min  Charges:    $Gait Training: 8-22 mins PT General Charges $$ ACUTE PT VISIT: 1 Visit                     Lindsey Burke, PT, GCS 03/28/24,1:52 PM

## 2024-03-28 NOTE — Progress Notes (Addendum)
  PROGRESS NOTE    Lindsey Burke  ZOX:[id number] DOB: November 24, 1933 DOA: 03/25/2024 PCP: Westley Hammers, MD  [id number]  LOS: 2 days   Brief hospital course:   Assessment & Plan: Lindsey Burke is a 89 year old female with a past medical history significant for COPD, gallstones, hepatitis, kidney stones, mitral valve prolapse, hyperlipidemia, ovarian cyst, back pain, and macular degeneration who presented to [facility] ED on 03/25/2024 status post fall at home. The patient and family reports that she went to the bathroom at around 4:00 AM and had a fall and was too weak to get back up. She remained there until approximately 5 PM later in the day when family found her.   She met sepsis criteria, therefore IV fluid resuscitation, cultures, and broad-spectrum antibiotics administered.  Despite IV fluids she remained hypotensive requiring initiation of peripheral Levophed.  PCCM asked to admit.  Pt was weaned off pressor on 5/21 and transferred to TRH service on 5/22.  #Shock: Hypovolemic + Septic --off pressor, off midodrine  #Elevated Troponin, suspect demand ischemia Type 2 NSTEMI  -HS Troponin peaked at 1012 -Echocardiogram pending -Cardiology consulted, appreciate input ~ holding off on Heparin gtt given thrombocytopenia and low suspicion for ACS   #Severe Sepsis due to #UTI --tachycardia, leukocytosis, lactic acidosis --started on Cefepime & Vancomycin, then ceftriaxone  --cont ceftriaxone  pending urine cx   #Acute Kidney Injury #Rhabdomyolysis --Cr back to baseline with IVF --oral hydration now   #Mildly Elevated LFT's -Trend LFT's and coags -Avoid Hepatotoxic agents -RUQ US  showed Cholelithiasis with no acute cholecystitis.    #Thrombocytopenia, suspect due to Sepsis -Peripheral Smear negative for schistocytes  --monitor  Hypoglycemia --2/2 poor oral intake and poor reserve --s/p D5 infusion --encourage oral intake   DVT prophylaxis: SCD/Compression stockings Code  Status: Full code  Family Communication: daughter updated at bedside today Level of care: Med-Surg Dispo:   The patient is from: home Anticipated d/c is to: home Anticipated d/c date is: tomorrow   Subjective and Interval History:  Daughter reported pt was more confused.  Pt didn't want to eat.   Objective: Vitals:   03/27/24 1955 03/28/24 0406 03/28/24 0839 03/28/24 1535  BP: (!) 126/57 (!) 103/56 136/66 (!) 142/95  Pulse: 78 66 67 95  Resp: 20 20 17 17   Temp: 97.8 F (36.6 C) 98.6 F (37 C) 98.2 F (36.8 C) 98.6 F (37 C)  TempSrc: Oral Oral Oral Oral  SpO2: 96% 96% 99% 100%  Weight:      Height:        Intake/Output Summary (Last 24 hours) at 03/28/2024 1917 Last data filed at 03/28/2024 0600 Gross per 24 hour  Intake 999.25 ml  Output --  Net 999.25 ml   Filed Weights   03/25/24 2008 03/26/24 0844  Weight: 36.3 kg 39.3 kg    Examination:   Constitutional: NAD, alert, oriented HEENT: conjunctivae and lids normal, EOMI CV: No cyanosis.   RESP: normal respiratory effort, on RA Neuro: II - XII grossly intact.   Psych: subdued mood and affect.     Data Reviewed: I have personally reviewed labs and imaging studies  Time spent: 35 minutes  Garrison Kanner, MD [facility] If 7PM-7AM, please contact night-coverage 03/28/2024, 7:17 PM

## 2024-03-28 NOTE — Plan of Care (Signed)

## 2024-03-29 DIAGNOSIS — R6521 Severe sepsis with septic shock: Secondary | ICD-10-CM | POA: Diagnosis not present

## 2024-03-29 DIAGNOSIS — A419 Sepsis, unspecified organism: Secondary | ICD-10-CM | POA: Diagnosis not present

## 2024-03-29 LAB — URINE CULTURE: Culture: >=100000 — AB

## 2024-03-29 MED ORDER — ENSURE ENLIVE PO LIQD: 237.0000 mL | Freq: Two times a day (BID) | ORAL | Status: DC

## 2024-03-29 MED ORDER — METOPROLOL TARTRATE 25 MG PO TABS: 12.5000 mg | ORAL_TABLET | Freq: Two times a day (BID) | ORAL | Status: AC

## 2024-03-29 NOTE — Plan of Care (Signed)
  Problem: Education: Goal: Knowledge of General Education information will improve Description: Including pain rating scale, medication(s)/side effects and non-pharmacologic comfort measures Outcome: Progressing   Problem: Health Behavior/Discharge Planning: Goal: Ability to manage health-related needs will improve Outcome: Progressing   Problem: Clinical Measurements: Goal: Ability to maintain clinical measurements within normal limits will improve Outcome: Progressing Goal: Will remain free from infection Outcome: Progressing   Problem: Activity: Goal: Risk for activity intolerance will decrease Outcome: Progressing   Problem: Nutrition: Goal: Adequate nutrition will be maintained Outcome: Progressing   Problem: Elimination: Goal: Will not experience complications related to bowel motility Outcome: Progressing   Problem: Pain Managment: Goal: General experience of comfort will improve and/or be controlled Outcome: Progressing   Problem: Safety: Goal: Ability to remain free from injury will improve Outcome: Progressing   Problem: Skin Integrity: Goal: Risk for impaired skin integrity will decrease Outcome: Progressing

## 2024-03-29 NOTE — TOC Transition Note (Signed)
 Transition of Care [facility]) - Discharge Note   Patient Details  Name: Lindsey Burke MRN: [id number] Date of Birth: Nov 13, 1933  Transition of [facility]) CM/SW Contact:  Holland Lundborg, RN Phone Number: 03/29/2024, 1:09 PM   Clinical Narrative:     Patient with discharge orders, daughter at bedside and will provide transportation.  Cory with [facility] notified of discharge for PT/OT.   Final next level of care: Home w Home Health Services Barriers to Discharge: Barriers Resolved   Patient Goals and CMS Choice Patient states their goals for this hospitalization and ongoing recovery are:: home with home health          Discharge Placement                       Discharge Plan and Services Additional resources added to the After Visit Summary for                            HH Arranged: PT, OT HH Agency: [facility] Date [facility] Agency Contacted: 03/29/24 Time HH Agency Contacted: [phone] Representative spoke with at [facility] Agency: cory  Social Drivers of Health (SDOH) Interventions SDOH Screenings   Food Insecurity: No Food Insecurity (03/26/2024)  Housing: Low Risk  (03/26/2024)  Transportation Needs: No Transportation Needs (03/26/2024)  Utilities: Not At Risk (03/26/2024)  Social Connections: Moderately Integrated (03/26/2024)  Tobacco Use: Low Risk  (03/25/2024)  Recent Concern: Tobacco Use - Medium Risk (03/03/2024)   Received from [facility]     Readmission Risk Interventions     No data to display

## 2024-03-29 NOTE — Discharge Summary (Signed)
 Physician Discharge Summary   Lindsey Burke  female DOB: February 19, 1934  WUJ:[id number]  PCP: Westley Hammers, MD  Admit date: 03/25/2024 Discharge date: 03/29/2024  Admitted From: home Disposition:  home Daughter updated at bedside prior to discharge. Home Health: Yes CODE STATUS: Full code   Hospital Course:  For full details, please see H&P, progress notes, consult notes and ancillary notes.  Briefly,  Lindsey Burke is a 89 year old female with a past medical history significant for COPD, gallstones, hepatitis, kidney stones, mitral valve prolapse, who presented to [facility] ED on 03/25/2024 status post fall at home.   The patient and family reports that she went to the bathroom at around 4:00 AM and had a fall and was too weak to get back up. She remained there until approximately 5 PM later in the day when family found her.    She met sepsis criteria, therefore IV fluid resuscitation, cultures, and broad-spectrum antibiotics administered.  Despite IV fluids she remained hypotensive requiring initiation of peripheral Levophed.  PCCM asked to admit.  Pt was weaned off pressor on 5/21 and transferred to TRH service on 5/22.   #Shock: Hypovolemic + Septic --off pressor, off midodrine   #Elevated Troponin, suspect demand ischemia Type 2 NSTEMI  -HS Troponin peaked at 1012.  Cardiology consulted, "Elevated troponins in setting of severe sepsis with shock and rhabdo is most consistent with demand/supply mismatch and not ACS." -Echocardiogram with normal LVEF and no regional wall motion abnormalities.   #Severe Sepsis due to #UTI --tachycardia, leukocytosis, lactic acidosis --received 2 days of cefepime and 3 days of ceftriaxone .  Urine cx with pan-sensitive E coli.   #Acute Kidney Injury #Rhabdomyolysis --Cr 2.58 on presentation, improved with IVF.  Cr back to baseline 0.74 prior to discharge.   #Mildly Elevated LFT's -RUQ US  showed Cholelithiasis with no acute cholecystitis.   --trended down.   #Thrombocytopenia, suspect due to Sepsis --Peripheral Smear negative for schistocytes  --hold home ASA pending outpatient f/u   Hypoglycemia --2/2 poor oral intake and poor reserve --s/p D5 infusion  HTN --home Lopressor  reduced from 25 to 12.5 mg BID due to hypotension.   Discharge Diagnoses:  Principal Problem:   Septic shock (HCC)   30 Day Unplanned Readmission Risk Score    Flowsheet Row ED to Hosp-Admission (Current) from 03/25/2024 in [facility]  30 Day Unplanned Readmission Risk Score (%) 13.24 Filed at 03/29/2024 1200       This score is the patient's risk of an unplanned readmission within 30 days of being discharged (0 -100%). The score is based on dignosis, age, lab data, medications, orders, and past utilization.   Low:  0-14.9   Medium: 15-21.9   High: 22-29.9   Extreme: 30 and above         Discharge Instructions:  Allergies as of 03/29/2024       Reactions   Augmentin [amoxicillin-pot Clavulanate] Other (See Comments)   Elevated Liver Enzymes        Medication List     PAUSE taking these medications    aspirin  EC 81 MG tablet Wait to take this until your doctor or other care provider tells you to start again. Hold until cardiology followup due to low platelet.   Take 81 mg by mouth daily.       TAKE these medications    ascorbic acid  500 MG tablet Commonly known as: VITAMIN C Take 500 mg by mouth daily.   cyanocobalamin 1000 MCG  tablet Commonly known as: VITAMIN B12 Take 1,000 mcg by mouth daily.   docusate sodium  100 MG capsule Commonly known as: COLACE Take 100 mg by mouth 2 (two) times daily.   feeding supplement Liqd Take 237 mLs by mouth 2 (two) times daily between meals.   Fish Oil 1000 MG Caps Take 1 capsule by mouth daily.   gabapentin  300 MG capsule Commonly known as: NEURONTIN  Take 1 capsule by mouth at bedtime.   LORazepam  0.5 MG tablet Commonly known  as: ATIVAN  Take 1 tablet by mouth at bedtime.   metoprolol  tartrate 25 MG tablet Commonly known as: LOPRESSOR  Take 0.5 tablets (12.5 mg total) by mouth 2 (two) times daily. Reduced from 25 mg. What changed:  how much to take additional instructions   omeprazole  40 MG capsule Commonly known as: PRILOSEC Take 1 capsule (40 mg total) by mouth daily.   PRESERVISION AREDS PO Take 1 tablet by mouth daily.         Follow-up Information     Paraschos, Alexander, MD. Go in 1 week(s).   Specialty: Cardiology Contact information: [address] [facility] [place] [address] [phone]         Westley Hammers, MD Follow up in 1 week(s).   Specialty: Internal Medicine Contact information: [address]   [place] [address] [phone]                 Allergies  Allergen Reactions   Augmentin [Amoxicillin-Pot Clavulanate] Other (See Comments)    Elevated Liver Enzymes     The results of significant diagnostics from this hospitalization (including imaging, microbiology, ancillary and laboratory) are listed below for reference.   Consultations:   Procedures/Studies: ECHOCARDIOGRAM COMPLETE Result Date: 03/27/2024    ECHOCARDIOGRAM REPORT   Patient Name:   Lindsey Burke [facility] Date of Exam: 03/26/2024 Medical Rec #:  [id number]           Height:       60.0 in Accession #:    [id number]          Weight:       86.6 lb Date of Birth:  [birth year]            BSA:          1.308 m Patient Age:    89 years            BP:           134/76 mmHg Patient Gender: F                   HR:           76 bpm. Exam Location:  [facility] Procedure: 2D Echo, Cardiac Doppler and Color Doppler (Both Spectral and Color            Flow Doppler were utilized during procedure). Indications:     R57.9 Shock  History:         Patient has prior history of Echocardiogram examinations, most                  recent 10/14/2021. COPD, Mitral Valve Prolapse; Risk                   Factors:Dyslipidemia.  Sonographer:     Brigid Canada RDCS Referring Phys:  [id number] Delanna Fears Diagnosing Phys: Dwayne D Callwood MD IMPRESSIONS  1. Left ventricular ejection fraction, by estimation, is 55 to 60%. The left  ventricle has normal function. The left ventricle has no regional wall motion abnormalities. Left ventricular diastolic parameters were normal.  2. Right ventricular systolic function is normal. The right ventricular size is normal.  3. The mitral valve is grossly normal. Mild mitral valve regurgitation.  4. Tricuspid valve regurgitation is mild to moderate.  5. The aortic valve is normal in structure. Aortic valve regurgitation is not visualized. FINDINGS  Left Ventricle: Left ventricular ejection fraction, by estimation, is 55 to 60%. The left ventricle has normal function. The left ventricle has no regional wall motion abnormalities. Global longitudinal strain performed but not reported based on interpreter judgement due to suboptimal tracking. The left ventricular internal cavity size was normal in size. There is no left ventricular hypertrophy. Left ventricular diastolic parameters were normal. Right Ventricle: The right ventricular size is normal. No increase in right ventricular wall thickness. Right ventricular systolic function is normal. Left Atrium: Left atrial size was normal in size. Right Atrium: Right atrial size was normal in size. Pericardium: There is no evidence of pericardial effusion. Mitral Valve: The mitral valve is grossly normal. Mild mitral valve regurgitation. Tricuspid Valve: The tricuspid valve is grossly normal. Tricuspid valve regurgitation is mild to moderate. Aortic Valve: The aortic valve is normal in structure. Aortic valve regurgitation is not visualized. Pulmonic Valve: The pulmonic valve was normal in structure. Pulmonic valve regurgitation is not visualized. Aorta: The ascending aorta was not well visualized. IAS/Shunts: No atrial level  shunt detected by color flow Doppler. Additional Comments: 3D was performed not requiring image post processing on an independent workstation and was indeterminate.  LEFT VENTRICLE PLAX 2D LVIDd:         2.80 cm   Diastology LVIDs:         2.00 cm   LV e' medial:    8.30 cm/s LV PW:         1.00 cm   LV E/e' medial:  9.4 LV IVS:        1.10 cm   LV e' lateral:   11.00 cm/s LVOT diam:     1.90 cm   LV E/e' lateral: 7.1 LV SV:         55 LV SV Index:   42 LVOT Area:     2.84 cm  RIGHT VENTRICLE             IVC RV Basal diam:  3.80 cm     IVC diam: 0.90 cm RV S prime:     11.25 cm/s TAPSE (M-mode): 2.0 cm LEFT ATRIUM             Index        RIGHT ATRIUM           Index LA diam:        3.20 cm 2.45 cm/m   RA Area:     13.90 cm LA Vol (A2C):   29.5 ml 22.55 ml/m  RA Volume:   38.10 ml  29.13 ml/m LA Vol (A4C):   49.6 ml 37.92 ml/m LA Biplane Vol: 38.9 ml 29.74 ml/m  AORTIC VALVE LVOT Vmax:   94.40 cm/s LVOT Vmean:  63.733 cm/s LVOT VTI:    0.193 m  AORTA Ao Root diam: 2.80 cm MITRAL VALVE               TRICUSPID VALVE MV Area (PHT): 4.89 cm    TR Peak grad:   37.9 mmHg MV Decel Time: 155 msec    TR Vmax:  308.00 cm/s MV E velocity: 77.85 cm/s MV A velocity: 73.90 cm/s  SHUNTS MV E/A ratio:  1.05        Systemic VTI:  0.19 m                            Systemic Diam: 1.90 cm Antonette Batters MD Electronically signed by Antonette Batters MD Signature Date/Time: 03/27/2024/8:26:52 AM    Final    US  Abdomen Limited RUQ (LIVER/GB) Result Date: 03/26/2024 CLINICAL DATA:  221910 Elevated LFTs 221910 EXAM: ULTRASOUND ABDOMEN LIMITED RIGHT UPPER QUADRANT COMPARISON:  CT renal 04/26/2023 FINDINGS: Gallbladder: Gallstones within the gallbladder lumen. No gallbladder wall thickening or pericholecystic fluid visualized. No sonographic Murphy sign noted by sonographer. Common bile duct: Diameter: 3 mm Liver: No focal lesion identified. Within normal limits in parenchymal echogenicity. Portal vein is patent on color  Doppler imaging with normal direction of blood flow towards the liver. Other right pleural effusion. IMPRESSION: 1. Cholelithiasis with no acute cholecystitis. 2. Right pleural effusion. Electronically Signed   By: Morgane  Naveau M.D.   On: 03/26/2024 20:29   US  RENAL Result Date: 03/26/2024 CLINICAL DATA:  Acute kidney injury. EXAM: RENAL / URINARY TRACT ULTRASOUND COMPLETE COMPARISON:  Renal ultrasound 07/25/2023. FINDINGS: Right Kidney: Renal measurements: 10.4 x 4.3 x 5.4 cm = volume: 126 mm. There is a cyst in the inferior pole measuring 3.6 x 2.1 x 2.6 cm. Echogenicity is increased. No mass or hydronephrosis visualized. Left Kidney: Renal measurements: 9.3 x 4.6 x 4.1 cm = volume: mL. Echogenicity is increased. There is a simple cyst in the inferior pole measuring 1.0 x 1.1 x 1.0 cm. No mass or hydronephrosis visualized. Bladder: There is small amount of debris layering in the bladder. Other: None. IMPRESSION: 1. No hydronephrosis. 2. Increased echogenicity of the kidneys bilaterally consistent with medical renal disease. 3. Small amount of debris layering in the bladder. Correlate with urinalysis. Electronically Signed   By: Tyron Gallon M.D.   On: 03/26/2024 20:08   DG Chest Port 1 View Result Date: 03/26/2024 CLINICAL DATA:  100030 Leukocytosis 100030 EXAM: PORTABLE CHEST 1 VIEW COMPARISON:  10/12/2021. FINDINGS: There are probable atelectatic changes at the lung bases. Bilateral lung fields are otherwise clear. No dense consolidation or lung collapse. Bilateral costophrenic angles are clear. Stable cardio-mediastinal silhouette. No acute osseous abnormalities. The soft tissues are within normal limits. IMPRESSION: No active disease. Electronically Signed   By: Beula Brunswick M.D.   On: 03/26/2024 08:31      Labs: BNP (last 3 results) No results for input(s): "BNP" in the last 8760 hours. Basic Metabolic Panel: Recent Labs  Lab 03/25/24 2015 03/26/24 0911 03/26/24 1253 03/27/24 0340  03/28/24 0513  NA 145 142  --  143 141  K 4.3 4.6  --  4.0 3.6  CL 105 106  --  111 110  CO2 25 22  --  23 23  GLUCOSE 91 131*  --  66* 95  BUN 44* 48*  --  42* 23  CREATININE 2.58* 2.28* 2.14* 1.30* 0.74  CALCIUM  9.4 8.3*  --  8.5* 8.1*  MG  --   --   --   --  1.8  PHOS  --  3.6  --  2.6  --    Liver Function Tests: Recent Labs  Lab 03/25/24 2015 03/26/24 0911 03/27/24 0340  AST 144*  --  82*  ALT 114*  --  79*  ALKPHOS 54  --  69  BILITOT 0.6  --  1.2  PROT 6.6  --  5.3*  ALBUMIN 3.6 2.8* 2.7*  2.7*   No results for input(s): "LIPASE", "AMYLASE" in the last 168 hours. No results for input(s): "AMMONIA" in the last 168 hours. CBC: Recent Labs  Lab 03/25/24 2015 03/26/24 0910 03/27/24 0340  WBC 24.7* 16.0* 12.3*  NEUTROABS  --  12.9*  --   HGB 12.6 11.4* 12.3  HCT 40.0 35.3* 38.4  MCV 95.5 95.7 95.8  PLT 123* 93* 89*   Cardiac Enzymes: Recent Labs  Lab 03/25/24 2015 03/26/24 0911 03/26/24 1253 03/27/24 0340  CKTOTAL 982* 750* 651* 260*   BNP: Invalid input(s): "POCBNP" CBG: Recent Labs  Lab 03/26/24 0916 03/27/24 0742 03/27/24 0830 03/27/24 0906 03/27/24 1126  GLUCAP 116* 55* 46* 105* 119*   D-Dimer No results for input(s): "DDIMER" in the last 72 hours. Hgb A1c No results for input(s): "HGBA1C" in the last 72 hours. Lipid Profile No results for input(s): "CHOL", "HDL", "LDLCALC", "TRIG", "CHOLHDL", "LDLDIRECT" in the last 72 hours. Thyroid function studies No results for input(s): "TSH", "T4TOTAL", "T3FREE", "THYROIDAB" in the last 72 hours.  Invalid input(s): "FREET3" Anemia work up No results for input(s): "VITAMINB12", "FOLATE", "FERRITIN", "TIBC", "IRON ", "RETICCTPCT" in the last 72 hours. Urinalysis    Component Value Date/Time   COLORURINE AMBER (A) 03/25/2024 2340   APPEARANCEUR TURBID (A) 03/25/2024 2340   APPEARANCEUR Cloudy (A) 03/10/2024 1051   LABSPEC 1.019 03/25/2024 2340   PHURINE 5.0 03/25/2024 2340   GLUCOSEU NEGATIVE  03/25/2024 2340   HGBUR LARGE (A) 03/25/2024 2340   BILIRUBINUR NEGATIVE 03/25/2024 2340   BILIRUBINUR Negative 03/10/2024 1051   KETONESUR NEGATIVE 03/25/2024 2340   PROTEINUR >=300 (A) 03/25/2024 2340   NITRITE NEGATIVE 03/25/2024 2340   LEUKOCYTESUR MODERATE (A) 03/25/2024 2340   Sepsis Labs Recent Labs  Lab 03/25/24 2015 03/26/24 0910 03/27/24 0340  WBC 24.7* 16.0* 12.3*   Microbiology Recent Results (from the past 240 hours)  Urine Culture (for pregnant, neutropenic or urologic patients or patients with an indwelling urinary catheter)     Status: Abnormal   Collection Time: 03/25/24  8:16 PM   Specimen: Urine, Clean Catch  Result Value Ref Range Status   Specimen Description   Final    URINE, CLEAN CATCH Performed at [facility], [address] [place], [place] [address]    Special Requests   Final    NONE Performed at [facility], [address] [place], [place] [address]    Culture >=100,000 COLONIES/mL ESCHERICHIA COLI (A)  Final   Report Status 03/29/2024 FINAL  Final   Organism ID, Bacteria ESCHERICHIA COLI (A)  Final      Susceptibility   Escherichia coli - MIC*    AMPICILLIN <=2 SENSITIVE Sensitive     CEFAZOLIN <=4 SENSITIVE Sensitive     CEFEPIME <=0.12 SENSITIVE Sensitive     CEFTRIAXONE  <=0.25 SENSITIVE Sensitive     CIPROFLOXACIN <=0.25 SENSITIVE Sensitive     GENTAMICIN <=1 SENSITIVE Sensitive     IMIPENEM <=0.25 SENSITIVE Sensitive     NITROFURANTOIN <=16 SENSITIVE Sensitive     TRIMETH /SULFA  <=20 SENSITIVE Sensitive     AMPICILLIN/SULBACTAM <=2 SENSITIVE Sensitive     PIP/TAZO <=4 SENSITIVE Sensitive ug/mL    * >=100,000 COLONIES/mL ESCHERICHIA COLI  Blood Culture (routine x 2)     Status: None (Preliminary result)   Collection Time: 03/26/24  1:34 AM   Specimen: BLOOD  Result Value Ref Range Status   Specimen Description BLOOD LEFT ARM  Final   Special Requests   Final    BOTTLES DRAWN AEROBIC AND ANAEROBIC Blood  Culture results may not be optimal due to an inadequate volume of blood received in culture bottles   Culture   Final    NO GROWTH 3 DAYS Performed at [facility], [address] [place], [place] [address]    Report Status PENDING  Incomplete  MRSA Next Gen by PCR, Nasal     Status: None   Collection Time: 03/26/24  8:48 AM   Specimen: Nasal Mucosa; Nasal Swab  Result Value Ref Range Status   MRSA by PCR Next Gen NOT DETECTED NOT DETECTED Final    Comment: (NOTE) The GeneXpert MRSA Assay (FDA approved for NASAL specimens only), is one component of a comprehensive MRSA colonization surveillance program. It is not intended to diagnose MRSA infection nor to guide or monitor treatment for MRSA infections. Test performance is not FDA approved in patients less than 29 years old. Performed at [facility], [address] [place], [place] [address]   Blood Culture (routine x 2)     Status: None (Preliminary result)   Collection Time: 03/26/24  9:11 AM   Specimen: BLOOD  Result Value Ref Range Status   Specimen Description BLOOD BLOOD LEFT HAND  Final   Special Requests   Final    BOTTLES DRAWN AEROBIC ONLY Blood Culture results may not be optimal due to an inadequate volume of blood received in culture bottles   Culture   Final    NO GROWTH 3 DAYS Performed at [facility], [address] [place], [place] [address]    Report Status PENDING  Incomplete  SARS Coronavirus 2 by RT PCR (hospital order, performed in [facility] hospital lab) *cepheid single result test*     Status: None   Collection Time: 03/26/24 11:15 AM  Result Value Ref Range Status   SARS Coronavirus 2 by RT PCR NEGATIVE NEGATIVE Final    Comment: (NOTE) SARS-CoV-2 target nucleic acids are NOT DETECTED.  The SARS-CoV-2 RNA is generally detectable in upper and lower respiratory specimens during the acute phase of infection. The lowest concentration of SARS-CoV-2 viral copies this assay  can detect is 250 copies / mL. A negative result does not preclude SARS-CoV-2 infection and should not be used as the sole basis for treatment or other patient management decisions.  A negative result may occur with improper specimen collection / handling, submission of specimen other than nasopharyngeal swab, presence of viral mutation(s) within the areas targeted by this assay, and inadequate number of viral copies (<250 copies / mL). A negative result must be combined with clinical observations, patient history, and epidemiological information.  Fact Sheet for Patients:   RoadLapTop.co.za  Fact Sheet for Healthcare Providers: http://kim-miller.com/  This test is not yet approved or  cleared by the [place]  FDA and has been authorized for detection and/or diagnosis of SARS-CoV-2 by FDA under an Emergency Use Authorization (EUA).  This EUA will remain in effect (meaning this test can be used) for the duration of the COVID-19 declaration under Section 564(b)(1) of the Act, 21 U.S.C. section 360bbb-3(b)(1), unless the authorization is terminated or revoked sooner.  Performed at [facility], [address] [place], [place] [address]      Total time spend on discharging this patient, including the last patient exam, discussing the hospital stay, instructions for ongoing  care as it relates to all pertinent caregivers, as well as preparing the medical discharge records, prescriptions, and/or referrals as applicable, is 35 minutes.    Garrison Kanner, MD  [facility] 03/29/2024, 12:40 PM

## 2024-03-31 LAB — CULTURE, BLOOD (ROUTINE X 2)
Culture: NO GROWTH
Culture: NO GROWTH

## 2024-04-03 ENCOUNTER — Ambulatory Visit: Admitting: Physician Assistant

## 2024-04-03 ENCOUNTER — Encounter: Payer: Self-pay | Admitting: Physician Assistant

## 2024-04-03 VITALS — BP 103/69 | HR 80 | Ht 60.0 in | Wt 80.0 lb

## 2024-04-03 DIAGNOSIS — N390 Urinary tract infection, site not specified: Secondary | ICD-10-CM | POA: Diagnosis not present

## 2024-04-03 LAB — URINALYSIS, COMPLETE
Bilirubin, UA: NEGATIVE
Glucose, UA: NEGATIVE
Nitrite, UA: NEGATIVE
Specific Gravity, UA: 1.03 (ref 1.005–1.030)
Urobilinogen, Ur: 0.2 mg/dL (ref 0.2–1.0)
pH, UA: 6 (ref 5.0–7.5)

## 2024-04-03 LAB — MICROSCOPIC EXAMINATION: Epithelial Cells (non renal): >10 /HPF — AB (ref 0–10)

## 2024-04-03 MED ORDER — TRIMETHOPRIM 100 MG PO TABS: 100.0000 mg | ORAL_TABLET | Freq: Every day | ORAL | 3 refills | Status: DC

## 2024-04-03 NOTE — Progress Notes (Signed)
 04/03/2024 8:56 AM   Lindsey Burke Lindsey Burke October 02, 1934 [id number]  CC: Chief Complaint  Patient presents with   Follow-up   HPI: Lindsey Burke is a 89 y.o. female with PMH recurrent UTI previously on suppressive trimethoprim  and OAB wet with mixed urge and stress incontinence who presents today for hospital follow-up.   I saw her in clinic most recently on 03/10/2024, at which point she is asymptomatic of infection with a positive UA.  I suspected she was chronically colonized.  She was subsequently admitted from 03/25/2024 to 03/29/2024 with sepsis with septic shock due to UTI and rhabdomyolysis with AKI.  Urine culture grew pansensitive E. coli and she was treated with cefepime , vancomycin , and transition to Rocephin .  Today she reports persistent weakness since her recent illness.  She is asymptomatic of UTI.  She has been getting home health PT and staying with her daughter, who accompanies her today.  In-office UA today positive for 2+ protein, trace intact blood, trace ketones, and trace leukocytes; urine microscopy with 11-30 WBCs/HPF, 11-30 RBCs/HPF, >10 epithelial cells/hpf, and moderate bacteria.  PMH: Past Medical History:  Diagnosis Date   Back pain    Cholelithiasis    COPD (chronic obstructive pulmonary disease) (HCC)    Gallstones    GERD (gastroesophageal reflux disease)    Hammertoe    Hepatitis    Hyperlipidemia    Kidney stones 1970   Macular degeneration    MVP (mitral valve prolapse)    Nephrolithiasis    Ovarian cyst 2015   Ovarian cyst     Surgical History: Past Surgical History:  Procedure Laterality Date   AUGMENTATION MAMMAPLASTY Bilateral    bilateral implants   BREAST SURGERY     implants   colonscopy  11/06/2010   Unremarkable-Dr Felicita Horns   ESOPHAGOGASTRODUODENOSCOPY (EGD) WITH PROPOFOL  N/A 06/18/2015   Procedure: ESOPHAGOGASTRODUODENOSCOPY (EGD) WITH PROPOFOL ;  Surgeon: Cassie Click, MD;  Location: [facility] ENDOSCOPY;  Service: Endoscopy;   Laterality: N/A;   ESOPHAGOGASTRODUODENOSCOPY (EGD) WITH PROPOFOL  N/A 10/16/2021   Procedure: ESOPHAGOGASTRODUODENOSCOPY (EGD) WITH PROPOFOL ;  Surgeon: Selena Daily, MD;  Location: [facility] ENDOSCOPY;  Service: Gastroenterology;  Laterality: N/A;   SAVORY DILATION N/A 06/18/2015   Procedure: SAVORY DILATION;  Surgeon: Cassie Click, MD;  Location: [facility] ENDOSCOPY;  Service: Endoscopy;  Laterality: N/A;    Home Medications:  Allergies as of 04/03/2024       Reactions   Augmentin [amoxicillin-pot Clavulanate] Other (See Comments)   Elevated Liver Enzymes        Medication List        Accurate as of Apr 03, 2024  8:56 AM. If you have any questions, ask your nurse or doctor.          PAUSE taking these medications    aspirin  EC 81 MG tablet Wait to take this until your doctor or other care provider tells you to start again. Hold until cardiology followup due to low platelet.   Take 81 mg by mouth daily.       TAKE these medications    ascorbic acid  500 MG tablet Commonly known as: VITAMIN C Take 500 mg by mouth daily.   cyanocobalamin 1000 MCG tablet Commonly known as: VITAMIN B12 Take 1,000 mcg by mouth daily.   docusate sodium  100 MG capsule Commonly known as: COLACE Take 100 mg by mouth 2 (two) times daily.   feeding supplement Liqd Take 237 mLs by mouth 2 (two) times daily between meals.   Fish Oil  1000 MG Caps Take 1 capsule by mouth daily.   gabapentin  300 MG capsule Commonly known as: NEURONTIN  Take 1 capsule by mouth at bedtime.   LORazepam  0.5 MG tablet Commonly known as: ATIVAN  Take 1 tablet by mouth at bedtime.   metoprolol  tartrate 25 MG tablet Commonly known as: LOPRESSOR  Take 0.5 tablets (12.5 mg total) by mouth 2 (two) times daily. Reduced from 25 mg.   omeprazole  40 MG capsule Commonly known as: PRILOSEC Take 1 capsule (40 mg total) by mouth daily.   PRESERVISION AREDS PO Take 1 tablet by mouth daily.        Allergies:   Allergies  Allergen Reactions   Augmentin [Amoxicillin-Pot Clavulanate] Other (See Comments)    Elevated Liver Enzymes    Family History: Family History  Problem Relation Age of Onset   Cancer Mother    Cholelithiasis Father    Tuberculosis Father    Cancer Father        Prostate and Larynx   Cholelithiasis Sister    Breast cancer Maternal Aunt     Social History:   reports that she has never smoked. She has never used smokeless tobacco. She reports current alcohol use of about 1.0 standard drink of alcohol per week. She reports that she does not use drugs.  Physical Exam: BP 103/69   Pulse 80   Ht 5' (1.524 m)   Wt 80 lb (36.3 kg)   BMI 15.62 kg/m   Constitutional:  Alert and oriented, no acute distress, nontoxic appearing, fatigued appearing HEENT: [place], AT Cardiovascular: No clubbing, cyanosis, or edema Respiratory: Normal respiratory effort, no increased work of breathing Skin: No rashes, bruises or suspicious lesions Neurologic: Grossly intact, no focal deficits, moving all 4 extremities Psychiatric: Normal mood and affect  Laboratory Data: Results for orders placed or performed in visit on 04/03/24  Microscopic Examination   Collection Time: 04/03/24  9:10 AM   Urine  Result Value Ref Range   WBC, UA 11-30 (A) 0 - 5 /hpf   RBC, Urine 11-30 (A) 0 - 2 /hpf   Epithelial Cells (non renal) >10 (A) 0 - 10 /hpf   Mucus, UA Present (A) Not Estab.   Bacteria, UA Moderate (A) None seen/Few  Urinalysis, Complete   Collection Time: 04/03/24  9:10 AM  Result Value Ref Range   Specific Gravity, UA 1.030 1.005 - 1.030   pH, UA 6.0 5.0 - 7.5   Color, UA Yellow Yellow   Appearance Ur Clear Clear   Leukocytes,UA Trace (A) Negative   Protein,UA 2+ (A) Negative/Trace   Glucose, UA Negative Negative   Ketones, UA Trace (A) Negative   RBC, UA Trace (A) Negative   Bilirubin, UA Negative Negative   Urobilinogen, Ur 0.2 0.2 - 1.0 mg/dL   Nitrite, UA Negative Negative    Microscopic Examination See below:    Assessment & Plan:   1. Recurrent UTI (Primary) With severity of recent illness, I agree with resuming daily suppressive antibiotics to reduce risk for reinfection.  We discussed that she may still have breakthrough infections on this.  Her UA appears contaminated today, will send for culture and escalate therapy to a treatment course of antibiotics if indicated before returning to daily suppressive therapy.  They agree. - Urinalysis, Complete - CULTURE, URINE COMPREHENSIVE - trimethoprim  (TRIMPEX ) 100 MG tablet; Take 1 tablet (100 mg total) by mouth daily.  Dispense: 90 tablet; Refill: 3   Return in about 6 months (around 10/04/2024) for rUTI follow  up.  Kailah Pennel, PA-C  [facility] [place] [address] [place], [place] [address] [phone]

## 2024-04-09 ENCOUNTER — Ambulatory Visit: Payer: Self-pay | Admitting: Urology

## 2024-04-09 DIAGNOSIS — N390 Urinary tract infection, site not specified: Secondary | ICD-10-CM

## 2024-04-09 LAB — CULTURE, URINE COMPREHENSIVE

## 2024-04-11 MED ORDER — FLUCONAZOLE 150 MG PO TABS: 150.0000 mg | ORAL_TABLET | Freq: Once | ORAL | 0 refills | Status: AC

## 2024-07-15 ENCOUNTER — Other Ambulatory Visit: Payer: Self-pay

## 2024-07-15 ENCOUNTER — Emergency Department: Admission: EM | Admit: 2024-07-15 | Discharge: 2024-07-15 | Disposition: A | Discharge status: Home / Self Care

## 2024-07-15 DIAGNOSIS — B029 Zoster without complications: Secondary | ICD-10-CM | POA: Insufficient documentation

## 2024-07-15 DIAGNOSIS — R21 Rash and other nonspecific skin eruption: Secondary | ICD-10-CM | POA: Diagnosis present

## 2024-07-15 DIAGNOSIS — J449 Chronic obstructive pulmonary disease, unspecified: Secondary | ICD-10-CM | POA: Insufficient documentation

## 2024-07-15 LAB — BASIC METABOLIC PANEL WITH GFR
Anion gap: 8 (ref 5–15)
BUN: 25 mg/dL — ABNORMAL HIGH (ref 8–23)
CO2: 27 mmol/L (ref 22–32)
Calcium: 9.9 mg/dL (ref 8.9–10.3)
Chloride: 106 mmol/L (ref 98–111)
Creatinine, Ser: 0.84 mg/dL (ref 0.44–1.00)
GFR, Estimated: >60 mL/min (ref >60)
Glucose, Bld: 98 mg/dL (ref 70–99)
Potassium: 4.7 mmol/L (ref 3.5–5.1)
Sodium: 141 mmol/L (ref 135–145)

## 2024-07-15 LAB — CBC WITH DIFFERENTIAL/PLATELET
Abs Immature Granulocytes: 0.01 K/uL (ref 0.00–0.07)
Basophils Absolute: 0.1 K/uL (ref 0.0–0.1)
Basophils Relative: 1 %
Eosinophils Absolute: 0.1 K/uL (ref 0.0–0.5)
Eosinophils Relative: 2 %
HCT: 38.7 % (ref 36.0–46.0)
Hemoglobin: 12.4 g/dL (ref 12.0–15.0)
Immature Granulocytes: 0 %
Lymphocytes Relative: 31 %
Lymphs Abs: 1.8 K/uL (ref 0.7–4.0)
MCH: 30 pg (ref 26.0–34.0)
MCHC: 32 g/dL (ref 30.0–36.0)
MCV: 93.7 fL (ref 80.0–100.0)
Monocytes Absolute: 0.7 K/uL (ref 0.1–1.0)
Monocytes Relative: 12 %
Neutro Abs: 3.3 K/uL (ref 1.7–7.7)
Neutrophils Relative %: 54 %
Platelets: 219 K/uL (ref 150–400)
RBC: 4.13 MIL/uL (ref 3.87–5.11)
RDW: 13.1 % (ref 11.5–15.5)
WBC: 5.9 K/uL (ref 4.0–10.5)
nRBC: 0 % (ref 0.0–0.2)

## 2024-07-15 MED ORDER — FLUORESCEIN SODIUM 1 MG OP STRP
1.0000 | ORAL_STRIP | Freq: Once | OPHTHALMIC | Status: AC
  Administered 2024-07-15: 1 via OPHTHALMIC
  Filled 2024-07-15: qty 1

## 2024-07-15 MED ORDER — VALACYCLOVIR HCL 1 G PO TABS: 1000.0000 mg | ORAL_TABLET | Freq: Three times a day (TID) | ORAL | 0 refills | Status: AC

## 2024-07-15 MED ORDER — ACETAMINOPHEN 325 MG PO TABS
650.0000 mg | ORAL_TABLET | Freq: Once | ORAL | Status: AC
  Administered 2024-07-15: 650 mg via ORAL
  Filled 2024-07-15: qty 2

## 2024-07-15 MED ORDER — VALACYCLOVIR HCL 500 MG PO TABS
1000.0000 mg | ORAL_TABLET | Freq: Once | ORAL | Status: AC
  Administered 2024-07-15: 1000 mg via ORAL
  Filled 2024-07-15: qty 2

## 2024-07-15 NOTE — Discharge Instructions (Signed)
 You were seen in the emergency department with 1 day of worsening facial rash.  I do think this is consistent with shingles and you were initiated on Valtrex .  Your kidney function today was great with a GFR greater than 60.  I do think you need your kidney function rechecked in 1 week after being on this medication.  Follow-up with your primary care physician as already scheduled tomorrow.  Use over-the-counter Tylenol  and ibuprofen  as needed for pain.  Return with any acutely worsening symptoms or any other emergency.  It was very nice meeting you and I wish you the best of luck. -- RETURN PRECAUTIONS & AFTERCARE: (ENGLISH) RETURN PRECAUTIONS: Return immediately to the emergency department or see/call your doctor if you feel worse, weak or have changes in speech or vision, are short of breath, have fever, vomiting, pain, bleeding or dark stool, trouble urinating or any new issues. Return here or see/call your doctor if not improving as expected for your suspected condition. FOLLOW-UP CARE: Call your doctor and/or any doctors we referred you to for more advice and to make an appointment. Do this today, tomorrow or after the weekend. Some doctors only take PPO insurance so if you have HMO insurance you may want to contact your HMO or your regular doctor for referral to a specialist within your plan. Either way tell the doctor's office that it was a referral from the emergency department so you get the soonest possible appointment.  YOUR TEST RESULTS: Take result reports of any blood or urine tests, imaging tests and EKG's to your doctor and any referral doctor. Have any abnormal tests repeated. Your doctor or a referral doctor can let you know when this should be done. Also make sure your doctor contacts this hospital to get any test results that are not currently available such as cultures or special tests for infection and final imaging reports, which are often not available at the time you leave the ER but  which may list additional important findings that are not documented on the preliminary report. BLOOD PRESSURE: If your blood pressure was greater than 120/80 have your blood pressure rechecked within 1 to 2 weeks. MEDICATION SIDE EFFECTS: Do not drive, walk, bike, take the bus, etc. if you have received or are being prescribed any sedating medications such as those for pain or anxiety or certain antihistamines like Benadryl. If you have been give one of these here get a taxi home or have a friend drive you home. Ask your pharmacist to counsel you on potential side effects of any new medication

## 2024-07-15 NOTE — ED Provider Notes (Signed)
 [facility] Provider Note    Event Date/Time   First MD Initiated Contact with Patient 07/15/24 2021     (approximate)   History   Rash   HPI  Lindsey Burke is a 89 y.o. female  with a past medical history significant for COPD, gallstones, hepatitis, kidney stones, mitral valve prolapse, who presented to [facility] ED 1 day of rash to the right side of her face.  Patient noticed earlier this week that she had a tingling sensation on the right side of her face that she thought may have been attributed to dry skin.  This morning she noticed bumps a pill to the right side of her face and she has pain in the area along with her right eyelid.  She denies any hearing or vision changes.  She presents with her daughter who contributes to the history.  The does have a primary care physician appointment scheduled for tomorrow      Physical Exam   Triage Vital Signs: ED Triage Vitals  Encounter Vitals Group     BP 07/15/24 1953 139/78     Girls Systolic BP Percentile --      Girls Diastolic BP Percentile --      Boys Systolic BP Percentile --      Boys Diastolic BP Percentile --      Pulse Rate 07/15/24 1953 96     Resp 07/15/24 1953 16     Temp 07/15/24 1953 98.3 F (36.8 C)     Temp Source 07/15/24 1953 Oral     SpO2 07/15/24 1953 98 %     Weight 07/15/24 1954 85 lb (38.6 kg)     Height 07/15/24 1954 5' (1.524 m)     Head Circumference --      Peak Flow --      Pain Score 07/15/24 1958 7     Pain Loc --      Pain Education --      Exclude from Growth Chart --     Most recent vital signs: Vitals:   07/15/24 1953  BP: 139/78  Pulse: 96  Resp: 16  Temp: 98.3 F (36.8 C)  SpO2: 98%    Nursing Triage Note reviewed. Vital signs reviewed and patients oxygen saturation is normoxic  General: Patient is well nourished, well developed, awake and alert, resting comfortably in no acute distress Head: Normocephalic and atraumatic   Eyes: Normal  inspection, extraocular muscles intact, no conjunctival pallor Eyes were examined under fluorescein  and I do not appreciate any keratitis Ear, nose, throat: Normal external exam TMs without abnormality Neck: Normal range of motion Respiratory: Patient is in no respiratory distress, lungs CTAB Cardiovascular: Patient is not tachycardic, RRR without murmur appreciated GI: Abd SNT with no guarding or rebound  Back: Normal inspection of the back with good strength and range of motion throughout all ext Extremities: pulses intact with good cap refills, no LE pitting edema or calf tenderness Neuro: The patient is alert and oriented to person, place, and time, appropriately conversive, with 5/5 bilat UE/LE strength, no gross motor or sensory defects noted. Coordination appears to be adequate.  Psych: normal mood and affect, no SI or HI  ED Results / Procedures / Treatments   Labs (all labs ordered are listed, but only abnormal results are displayed) Labs Reviewed  BASIC METABOLIC PANEL WITH GFR - Abnormal; Notable for the following components:      Result Value   BUN 25 (*)  All other components within normal limits  CBC WITH DIFFERENTIAL/PLATELET     EKG None  RADIOLOGY None    PROCEDURES:  Critical Care performed: No  Procedures   MEDICATIONS ORDERED IN ED: Medications  fluorescein  ophthalmic strip 1 strip (1 strip Right Eye Given 07/15/24 2107)  acetaminophen  (TYLENOL ) tablet 650 mg (650 mg Oral Given 07/15/24 2107)  valACYclovir  (VALTREX ) tablet 1,000 mg (1,000 mg Oral Given 07/15/24 2113)     IMPRESSION / MDM / ASSESSMENT AND PLAN / ED COURSE                                Differential diagnosis includes, but is not limited to, shingles, electrolyte derangement, acute renal insufficiency, anemia  ED course: Patient is well-appearing and given the prodrome and the appearance of the rash today I do think patient's presentation is consistent with herpes zoster.  She  has likely no evidence of keratitis at this time under fluorescein  or Ramsay Hunt.  She is not anemic and has no leukocytosis and has no acute renal insufficiency.  Given this I do think she can tolerate a therapeutic course of Valtrex  and have initiated 1000 mg 3 times daily with instructions to recheck her renal function in 1 week's time.  She was advised regarding zoster precautions including not being around anyone pregnant or immunocompromised.  She will use over-the-counter Tylenol  and ibuprofen  at this time (will hold off on gabapentin ).  All questions answered and patient voiced understanding and plans on still following up with her primary care physician tomorrow which I think is helpful   Clinical Course as of 07/15/24 2214  Tue Jul 15, 2024  2157 Creatinine: 0.84 Creatinine is normal [HD]  2159 GFR, Estimated: >60 No limitation in GFR [HD]    Clinical Course User Index [HD] Nicholaus Rolland BRAVO, MD   -- Risk: 5 This patient has a high risk of morbidity due to further diagnostic testing or treatment. Rationale: This patient's evaluation and management involve a high risk of morbidity due to the potential severity of presenting symptoms, need for diagnostic testing, and/or initiation of treatment that may require close monitoring. The differential includes conditions with potential for significant deterioration or requiring escalation of care. Treatment decisions in the ED, including medication administration, procedural interventions, or disposition planning, reflect this level of risk. COPA: 5 The patient has the following acute or chronic illness/injury that poses a possible threat to life or bodily function: [X] : The patient has a potentially serious acute condition or an acute exacerbation of a chronic illness requiring urgent evaluation and management in the Emergency Department. The clinical presentation necessitates immediate consideration of life-threatening or function-threatening  diagnoses, even if they are ultimately ruled out.   FINAL CLINICAL IMPRESSION(S) / ED DIAGNOSES   Final diagnoses:  Herpes zoster without complication     Rx / DC Orders   ED Discharge Orders          Ordered    valACYclovir  (VALTREX ) 1000 MG tablet  3 times daily        07/15/24 2203             Note:  This document was prepared using Dragon voice recognition software and may include unintentional dictation errors.   Nicholaus Rolland BRAVO, MD 07/15/24 2214

## 2024-07-15 NOTE — ED Triage Notes (Signed)
 Pt reports rash redness and scabbed areas for right side of face that she has had over the past few days but it has gotten worse today. Pt reports itchy eyes

## 2024-08-29 NOTE — Progress Notes (Signed)
 Established Patient Visit   Chief Complaint: Chief Complaint  Patient presents with   Follow-up    4 month    Date of Service: 08/29/2024 Date of Birth: 03/15/1934 PCP: Minor, Jonette Penning, PA  History of Present Illness: Lindsey Burke is a 89 y.o.female patient who returns for   1.  Mitral valve prolapse  2.  Hyperlipidemia  3.  COPD  4.  CKD stage IIIa  Patient previously followed by Dr. Bosie.  The patient was admitted to [facility] 03/25/2024 with urosepsis and rhabdomyolysis.  She returns today, reports doing okay.  She denies exertional chest pain.  She reports mild intermittent exertional dyspnea.  She denies palpitations or heart racing.  She denies peripheral edema.  The patient is active, does some walking, receives physical therapy twice weekly.  2D echocardiogram 03/26/2024 revealed LVEF 55 to 60%, with mild to moderate tricuspid regurgitation, and mild mitral regurgitation.  The patient has diet-controlled hyperlipidemia, LDL 74 on 04/26/2022.  The patient follows a low-cholesterol, low-fat diet.  Past Medical and Surgical History  Past Medical History Past Medical History:  Diagnosis Date   Chronic kidney disease    COPD (chronic obstructive pulmonary disease) (CMS/HHS-HCC)    Eye problems    Gallstones    Hammertoe    Bilateral. Worse on right   History of blood transfusion    Hyperlipidemia    Lung cancer (CMS/HHS-HCC)    Macular degeneration    MVP (mitral valve prolapse)    Nephrolithiasis    Ovarian cyst     Past Surgical History She has a past surgical history that includes breast surgery (1992); Colonoscopy (09/25/2006); egd (06/18/2015); and COLOGUARD (11/06/2016).   Medications and Allergies  Current Medications  Current Outpatient Medications  Medication Sig Dispense Refill   cyanocobalamin (VITAMIN B12) 1000 MCG tablet Take 1,000 mcg by mouth once daily     docusate (DOK) 100 mg tablet Take 100 mg by mouth 2 (two) times daily      gabapentin  (NEURONTIN ) 300 MG capsule Take 1 capsule (300 mg total) by mouth 2 (two) times daily 270 capsule 2   omega 3-dha-epa-fish oil 1,000 mg (120 mg-180 mg) Cap Take by mouth     omeprazole  (PRILOSEC) 40 MG DR capsule      trimethoprim  100 mg tablet Take 100 mg by mouth once daily     VIT A/VIT C/VIT E/ZINC /COPPER (OCUVITE PRESERVISION ORAL) Take by mouth.     VITAMIN C 500 MG tablet      aspirin  81 MG chewable tablet Take 81 mg by mouth once daily (Patient not taking: Reported on 08/29/2024)     calcium  carbonate-vitamin D3 (OS-CAL 500+D) 500 mg(1,250mg ) -200 unit tablet Take 1 tablet by mouth 2 (two) times daily with meals. (Patient not taking: Reported on 08/29/2024)     No current facility-administered medications for this visit.    Allergies: Augmentin [amoxicillin-pot clavulanate]  Social and Family History  Social History  reports that she has never smoked. She has been exposed to tobacco smoke. She has never used smokeless tobacco. She reports that she does not drink alcohol and does not use drugs.  Family History Family History  Problem Relation Name Age of Onset   Heart disease Mother     Addison's disease Mother     High blood pressure (Hypertension) Mother     Tuberculosis Father     Cancer Father     Cholelithiasis Father     No Known Problems Son  No Known Problems Son     No Known Problems Daughter     Cholelithiasis Sister     Gallbladder disease Sister     Heart disease Brother     High blood pressure (Hypertension) Other     Heart disease Other     Asthma Other     Tuberculosis Other     Rheum arthritis Other     Thyroid disease Other      Review of Systems   Review of Systems: The patient denies chest pain, shortness of breath, orthopnea, paroxysmal nocturnal dyspnea, pedal edema, palpitations, heart racing, presyncope, syncope. Review of 12 Systems is negative except as described above.  Physical Examination    Vitals:BP 118/78   Pulse 75   Ht 152.4 cm (5')   Wt (!) 37.6 kg (83 lb)   SpO2 97%   BMI 16.21 kg/m  Ht:152.4 cm (5') Wt:(!) 37.6 kg (83 lb) ADJ:Anib surface area is 1.26 meters squared. Body mass index is 16.21 kg/m.  HEENT: Pupils equally reactive to light and accomodation  Neck: Supple without thyromegaly, carotid pulses 2+ Lungs: clear to auscultation bilaterally; no wheezes, rales, rhonchi Heart: Regular rate and rhythm.  No gallops, murmurs or rub Abdomen: soft nontender, nondistended, with normal bowel sounds Extremities: no cyanosis, clubbing, or edema Peripheral Pulses: 2+ in all extremities, 2+ femoral pulses bilaterally  Assessment   89 y.o. female with  1. MVP (mitral valve prolapse)-Dr. Paraschos   2. Chronic obstructive pulmonary disease, unspecified COPD type (CMS/HHS-HCC)   3. SOB (shortness of breath)   4. Mixed hyperlipidemia   5. Stage 3b chronic kidney disease (CKD) (CMS-HCC)    89year-old female with mitral valve prolapse of the anterior leaflet, with trivial mitral regurgitation, currently asymptomatic.  Patient recently hospitalized with urosepsis and rhabdomyolysis.  2D echocardiogram 03/26/2024 revealed LVEF 55-60% with mild to moderate tricuspid regurgitation, and mild mitral gravitation.  Patient has diet-controlled hyperlipidemia.    Plan   1.  Continue current medications 2.  Counseled patient about low-cholesterol diet 3.  Low-fat and cholesterol diet printed instructions given to the patient 4.  Return to clinic for follow-up in 6 months  No orders of the defined types were placed in this encounter.   Return in about 6 months (around 02/27/2025).  Lindsey DOOMS, MD PhD [facility]

## 2024-09-10 NOTE — Progress Notes (Signed)
 History of Present Illness:   Lindsey Burke is a 89 y.o. female here for   Verbally consented to the use of AI for note-taking.   Chief Complaint  Patient presents with   Conjunctivitis    LT eye symptoms of burning and itching but symptoms started in Rt eye      History of Present Illness Lindsey Burke is an 89 year old female who presents with eye irritation and crusting.  She has been experiencing irritation in her eye for the past two to three days, describing a sensation of a foreign body in her eye that leads to frequent rubbing. Her eye is crusted shut upon waking in the morning, and she uses a warm compress. She also experiences burning and itching sensations in the affected eye.  No history of similar symptoms in the past. No pain with eye movement and no discharge at the moment, although she sometimes feels like she can remove small particles by rubbing. She has only used a warm compress to relieve symptoms and has not tried any other treatments prior to this visit.  She feels tired and her eyes tend to close when she sits down, leading her to fall asleep. She mentions occasional throat congestion, which she attributes to allergies.  She enjoys spending time outside and mentions that she can no longer work but likes to watch traffic. She uses a walker for mobility and feels comfortable sitting outside.   Past Medical History:   Past Medical History:  Diagnosis Date   Chronic kidney disease    COPD (chronic obstructive pulmonary disease) (CMS/HHS-HCC)    Eye problems    Gallstones    Hammertoe    Bilateral. Worse on right   History of blood transfusion    Hyperlipidemia    Lung cancer (CMS/HHS-HCC)    Macular degeneration    MVP (mitral valve prolapse)    Nephrolithiasis    Ovarian cyst     Past Surgical History:   Past Surgical History:  Procedure Laterality Date   breast surgery  1992   breast implantation   COLONOSCOPY  09/25/2006   Int  Hemorrhoids: CBF 09/2016; Recall Ltr mailed 07/21/2016 (dw)   EGD  06/18/2015   No repeat per RTE   COLOGUARD  11/06/2016   NEGATIVE    Allergies:   Allergies  Allergen Reactions   Augmentin [Amoxicillin-Pot Clavulanate] Unknown    Current Medications:   Prior to Admission medications  Medication Sig Taking? Last Dose  cyanocobalamin (VITAMIN B12) 1000 MCG tablet Take 1,000 mcg by mouth once daily Yes Taking  docusate (DOK) 100 mg tablet Take 100 mg by mouth 2 (two) times daily Yes Taking  gabapentin  (NEURONTIN ) 300 MG capsule Take 1 capsule (300 mg total) by mouth 2 (two) times daily Yes Taking  omega 3-dha-epa-fish oil 1,000 mg (120 mg-180 mg) Cap Take by mouth Yes Taking  omeprazole  (PRILOSEC) 40 MG DR capsule  Yes Taking  trimethoprim  100 mg tablet Take 100 mg by mouth once daily Yes Taking  VIT A/VIT C/VIT E/ZINC /COPPER (OCUVITE PRESERVISION ORAL) Take by mouth. Yes Taking  VITAMIN C 500 MG tablet  Yes Taking  aspirin  81 MG chewable tablet Take 81 mg by mouth once daily Patient not taking: Reported on 08/29/2024    calcium  carbonate-vitamin D3 (OS-CAL 500+D) 500 mg(1,250mg ) -200 unit tablet Take 1 tablet by mouth 2 (two) times daily with meals. Patient not taking: Reported on 08/29/2024    erythromycin (ROMYCIN) ophthalmic ointment Place into both eyes  3 (three) times daily for 10 days      Family History:   Family History  Problem Relation Name Age of Onset   Heart disease Mother     Addison's disease Mother     High blood pressure (Hypertension) Mother     Tuberculosis Father     Cancer Father     Cholelithiasis Father     No Known Problems Son     No Known Problems Son     No Known Problems Daughter     Cholelithiasis Sister     Gallbladder disease Sister     Heart disease Brother     High blood pressure (Hypertension) Other     Heart disease Other     Asthma Other     Tuberculosis Other     Rheum arthritis Other     Thyroid disease  Other      Social History:   Social History   Socioeconomic History   Marital status: Widowed  Tobacco Use   Smoking status: Never    Passive exposure: Yes   Smokeless tobacco: Never   Tobacco comments:    Passive smoke exposure d/t Occupation. Never smoked.  Vaping Use   Vaping status: Never Used  Substance and Sexual Activity   Alcohol use: No   Drug use: No   Sexual activity: Defer    Partners: Male   Social Drivers of Health   Financial Resource Strain: Low Risk  (07/25/2024)   Overall Financial Resource Strain (CARDIA)    Difficulty of Paying Living Expenses: Not hard at all  Food Insecurity: No Food Insecurity (07/25/2024)   Hunger Vital Sign    Worried About Running Out of Food in the Last Year: Never true    Ran Out of Food in the Last Year: Never true  Transportation Needs: No Transportation Needs (07/25/2024)   PRAPARE - Administrator, Civil Service (Medical): No    Lack of Transportation (Non-Medical): No  Social Connections: Moderately Integrated (03/26/2024)   Received from [facility]   Social Connection and Isolation Panel    In a typical week, how many times do you talk on the phone with family, friends, or neighbors?: More than three times a week    How often do you get together with friends or relatives?: More than three times a week    How often do you attend church or religious services?: More than 4 times per year    Do you belong to any clubs or organizations such as church groups, unions, fraternal or athletic groups, or school groups?: Yes    How often do you attend meetings of the clubs or organizations you belong to?: Never    Are you married, widowed, divorced, separated, never married, or living with a partner?: Widowed  Housing Stability: Low Risk  (07/25/2024)   Housing Stability Vital Sign    Unable to Pay for Housing in the Last Year: No    Number of Times Moved in the Last Year: 0    Homeless in the Last  Year: No    Review of Systems:   A 10 point review of systems is negative, except for the pertinent positives and negatives detailed in the HPI.  Vitals:   Vitals:   09/10/24 1156  BP: 116/76  Pulse: 94  SpO2: 99%  Weight: (!) 38.1 kg (84 lb)  Height: 152.4 cm (5')     Body mass index is 16.41 kg/m.  Physical Exam:  Physical Exam Vitals and nursing note reviewed.  Constitutional:      General: She is not in acute distress.    Appearance: Normal appearance. She is not ill-appearing, toxic-appearing or diaphoretic.  HENT:     Head: Normocephalic and atraumatic.     Right Ear: External ear normal.     Left Ear: External ear normal.  Eyes:     Extraocular Movements: Extraocular movements intact.     Conjunctiva/sclera: Conjunctivae normal.     Pupils: Pupils are equal, round, and reactive to light.     Comments: Slightly injected sclera of the left eye.  Some swelling noted on the medial aspect of lower lid.  No discharge on exam.  Normal eyelid margins  Cardiovascular:     Rate and Rhythm: Normal rate and regular rhythm.     Pulses: Normal pulses.     Heart sounds: Normal heart sounds.  Pulmonary:     Effort: Pulmonary effort is normal.     Breath sounds: Normal breath sounds.  Neurological:     Mental Status: She is alert.     Assessment and Plan:  No results found for this visit on 09/10/24.   Assessment & Plan Suspected acute conjunctivitis, left eye Suspected acute conjunctivitis with burning, itching, and crusting. Differential includes blocked tear duct. - Prescribed erythromycin eye ointment, apply TID for 10 days. - Continue warm compresses to left eye. - Monitor for spread or lack of improvement; consider oral antibiotics if needed. - Advised follow-up if no improvement.  Disposition: Follow-up as needed  There are no Patient Instructions on file for this visit.   This note has been created using automated tools and reviewed for accuracy by  provider.  Patient received an After Visit Summary    Attestation Statement:   I personally performed the service, non-incident to. (WP)   MASON MCCLELLAND MINOR, PA

## 2024-09-26 ENCOUNTER — Ambulatory Visit: Admitting: Physician Assistant

## 2024-10-16 ENCOUNTER — Ambulatory Visit: Admitting: Physician Assistant

## 2024-10-16 ENCOUNTER — Encounter: Payer: Self-pay | Admitting: Physician Assistant

## 2024-10-16 VITALS — BP 156/93 | HR 91 | Ht 60.0 in | Wt 78.0 lb

## 2024-10-16 DIAGNOSIS — N390 Urinary tract infection, site not specified: Secondary | ICD-10-CM | POA: Diagnosis not present

## 2024-10-16 MED ORDER — TRIMETHOPRIM 100 MG PO TABS: 100.0000 mg | ORAL_TABLET | Freq: Every day | ORAL | 3 refills | Status: AC

## 2024-10-16 NOTE — Progress Notes (Signed)
 10/16/2024 1:25 PM   Lindsey Burke 04-Feb-1934 [id number]  CC: Chief Complaint  Patient presents with   Recurrent UTI   Follow-up   HPI: Lindsey Burke is a 89 y.o. female with PMH of [place], recurrent UTI with possible urinary colonization on suppressive trimethoprim , and OAB wet with mixed urge and stress incontinence who presents today for follow-up.  She is accompanied today by her daughter, Lindsey Burke, who contributes to HPI.  Today she reports infections since starting trimethoprim  6 months ago.  Overall she has been doing very well with no acute concerns.  They wish to continue trimethoprim .  In-office UA today positive for trace leukocytes; urine microscopy pan negative.  PMH: Past Medical History:  Diagnosis Date   Back pain    Cholelithiasis    COPD (chronic obstructive pulmonary disease) (HCC)    Gallstones    GERD (gastroesophageal reflux disease)    Hammertoe    Hepatitis    Hyperlipidemia    Kidney stones 1970   Macular degeneration    MVP (mitral valve prolapse)    Nephrolithiasis    Ovarian cyst 2015   Ovarian cyst     Surgical History: Past Surgical History:  Procedure Laterality Date   AUGMENTATION MAMMAPLASTY Bilateral    bilateral implants   BREAST SURGERY     implants   colonscopy  11/06/2010   Unremarkable-Dr Viktoria   ESOPHAGOGASTRODUODENOSCOPY (EGD) WITH PROPOFOL  N/A 06/18/2015   Procedure: ESOPHAGOGASTRODUODENOSCOPY (EGD) WITH PROPOFOL ;  Surgeon: Lamar ONEIDA Viktoria, MD;  Location: [facility] ENDOSCOPY;  Service: Endoscopy;  Laterality: N/A;   ESOPHAGOGASTRODUODENOSCOPY (EGD) WITH PROPOFOL  N/A 10/16/2021   Procedure: ESOPHAGOGASTRODUODENOSCOPY (EGD) WITH PROPOFOL ;  Surgeon: Unk Corinn Skiff, MD;  Location: [facility] ENDOSCOPY;  Service: Gastroenterology;  Laterality: N/A;   SAVORY DILATION N/A 06/18/2015   Procedure: SAVORY DILATION;  Surgeon: Lamar ONEIDA Viktoria, MD;  Location: [facility] ENDOSCOPY;  Service: Endoscopy;  Laterality: N/A;    Home  Medications:  Allergies as of 10/16/2024       Reactions   Augmentin [amoxicillin-pot Clavulanate] Other (See Comments)   Elevated Liver Enzymes        Medication List        Accurate as of October 16, 2024  1:25 PM. If you have any questions, ask your nurse or doctor.          STOP taking these medications    aspirin  EC 81 MG tablet Wait to take this until your doctor or other care provider tells you to start again. Hold until cardiology followup due to low platelet.   Stopped by: Lucie Hones   feeding supplement Liqd Stopped by: Medha Pippen   LORazepam  0.5 MG tablet Commonly known as: ATIVAN  Stopped by: Lucie Hones       TAKE these medications    ascorbic acid  500 MG tablet Commonly known as: VITAMIN C Take 500 mg by mouth daily.   cyanocobalamin 1000 MCG tablet Commonly known as: VITAMIN B12 Take 1,000 mcg by mouth daily.   docusate sodium  100 MG capsule Commonly known as: COLACE Take 100 mg by mouth 2 (two) times daily.   Fish Oil 1000 MG Caps Take 1 capsule by mouth daily.   gabapentin  300 MG capsule Commonly known as: NEURONTIN  Take 1 capsule by mouth at bedtime.   metoprolol  tartrate 25 MG tablet Commonly known as: LOPRESSOR  Take 0.5 tablets (12.5 mg total) by mouth 2 (two) times daily. Reduced from 25 mg.   omeprazole  40 MG capsule Commonly known as: PRILOSEC Take  1 capsule (40 mg total) by mouth daily.   PRESERVISION AREDS PO Take 1 tablet by mouth daily.   trimethoprim  100 MG tablet Commonly known as: TRIMPEX  Take 1 tablet (100 mg total) by mouth daily.        Allergies:  Allergies[1]  Family History: Family History  Problem Relation Age of Onset   Cancer Mother    Cholelithiasis Father    Tuberculosis Father    Cancer Father        Prostate and Larynx   Cholelithiasis Sister    Breast cancer Maternal Aunt     Social History:   reports that she has never smoked. She has never used  smokeless tobacco. She reports current alcohol use of about 1.0 standard drink of alcohol per week. She reports that she does not use drugs.  Physical Exam: BP (!) 156/93   Pulse 91   Ht 5' (1.524 m)   Wt 78 lb (35.4 kg)   BMI 15.23 kg/m   Constitutional:  Alert and oriented, no acute distress, nontoxic appearing HEENT: [place], AT Cardiovascular: No clubbing, cyanosis, or edema Respiratory: Normal respiratory effort, no increased work of breathing Skin: No rashes, bruises or suspicious lesions Neurologic: Grossly intact, no focal deficits, moving all 4 extremities Psychiatric: Normal mood and affect  Laboratory Data: Results for orders placed or performed in visit on 10/16/24  Microscopic Examination   Collection Time: 10/16/24  1:04 PM   Urine  Result Value Ref Range   WBC, UA WILL FOLLOW    RBC, Urine WILL FOLLOW    Epithelial Cells (non renal) WILL FOLLOW    Renal Epithel, UA WILL FOLLOW    Casts WILL FOLLOW    Cast Type WILL FOLLOW    Crystals WILL FOLLOW    Crystal Type WILL FOLLOW    Mucus, UA WILL FOLLOW    Bacteria, UA WILL FOLLOW    Yeast, UA WILL FOLLOW    Trichomonas, UA WILL FOLLOW    Urinalysis Comments WILL FOLLOW   Urinalysis, Complete   Collection Time: 10/16/24  1:04 PM  Result Value Ref Range   Specific Gravity, UA 1.020 1.005 - 1.030   pH, UA 6.0 5.0 - 7.5   Color, UA Yellow Yellow   Appearance Ur Clear Clear   Leukocytes,UA Trace (A) Negative   Protein,UA Negative Negative/Trace   Glucose, UA Negative Negative   Ketones, UA Negative Negative   RBC, UA Negative Negative   Bilirubin, UA Negative Negative   Urobilinogen, Ur 0.2 0.2 - 1.0 mg/dL   Nitrite, UA Negative Negative   Microscopic Examination See below:    Assessment & Plan:   1. Recurrent UTI (Primary) Asymptomatic and UA bland on suppressive trimethoprim .  Will continue this. - Urinalysis, Complete - trimethoprim  (TRIMPEX ) 100 MG tablet; Take 1 tablet (100 mg total) by mouth daily.   Dispense: 90 tablet; Refill: 3   Return in about 1 year (around 10/16/2025) for Annual rUTI follow up.  Lucie Hones, PA-C   [facility] [address] [place], [place] [address] [phone]      [1]  Allergies Allergen Reactions   Augmentin [Amoxicillin-Pot Clavulanate] Other (See Comments)    Elevated Liver Enzymes

## 2024-10-17 LAB — URINALYSIS, COMPLETE
Bilirubin, UA: NEGATIVE
Glucose, UA: NEGATIVE
Ketones, UA: NEGATIVE
Nitrite, UA: NEGATIVE
Protein,UA: NEGATIVE
RBC, UA: NEGATIVE
Specific Gravity, UA: 1.02 (ref 1.005–1.030)
Urobilinogen, Ur: 0.2 mg/dL (ref 0.2–1.0)
pH, UA: 6 (ref 5.0–7.5)

## 2024-10-17 LAB — MICROSCOPIC EXAMINATION

## 2025-10-15 ENCOUNTER — Ambulatory Visit: Admitting: Physician Assistant
